# Patient Record
Sex: Female | Born: 1980
Health system: Southern US, Community
[De-identification: ages and names within clinical notes are randomized; demographics above are authoritative.]

## PROBLEM LIST (undated history)

## (undated) DIAGNOSIS — K219 Gastro-esophageal reflux disease without esophagitis: Secondary | ICD-10-CM

## (undated) DIAGNOSIS — K2100 Gastro-esophageal reflux disease with esophagitis, without bleeding: Secondary | ICD-10-CM

## (undated) DIAGNOSIS — L309 Dermatitis, unspecified: Secondary | ICD-10-CM

## (undated) DIAGNOSIS — Z87442 Personal history of urinary calculi: Secondary | ICD-10-CM

## (undated) HISTORY — PX: CHOLECYSTECTOMY: SHX55

## (undated) HISTORY — DX: Gastro-esophageal reflux disease with esophagitis, without bleeding: K21.00

## (undated) HISTORY — DX: Dermatitis, unspecified: L30.9

## (undated) HISTORY — DX: Gastro-esophageal reflux disease with esophagitis: K21.0

---

## 2000-07-31 ENCOUNTER — Emergency Department (HOSPITAL_COMMUNITY): Admission: EM | Admit: 2000-07-31 | Discharge: 2000-07-31 | Payer: Self-pay | Admitting: Emergency Medicine

## 2000-12-04 ENCOUNTER — Ambulatory Visit (HOSPITAL_COMMUNITY): Admission: RE | Admit: 2000-12-04 | Discharge: 2000-12-04 | Payer: Self-pay | Admitting: *Deleted

## 2000-12-04 ENCOUNTER — Encounter: Payer: Self-pay | Admitting: *Deleted

## 2001-01-04 ENCOUNTER — Ambulatory Visit (HOSPITAL_COMMUNITY): Admission: RE | Admit: 2001-01-04 | Discharge: 2001-01-04 | Payer: Self-pay | Admitting: Internal Medicine

## 2001-01-04 ENCOUNTER — Encounter: Payer: Self-pay | Admitting: Internal Medicine

## 2001-04-24 ENCOUNTER — Inpatient Hospital Stay (HOSPITAL_COMMUNITY): Admission: AD | Admit: 2001-04-24 | Discharge: 2001-04-26 | Payer: Self-pay | Admitting: *Deleted

## 2003-01-28 ENCOUNTER — Ambulatory Visit (HOSPITAL_COMMUNITY): Admission: RE | Admit: 2003-01-28 | Discharge: 2003-01-28 | Payer: Self-pay | Admitting: Internal Medicine

## 2003-01-28 ENCOUNTER — Encounter: Payer: Self-pay | Admitting: Internal Medicine

## 2003-05-07 ENCOUNTER — Ambulatory Visit (HOSPITAL_COMMUNITY): Admission: RE | Admit: 2003-05-07 | Discharge: 2003-05-07 | Payer: Self-pay | Admitting: Endocrinology

## 2003-05-19 ENCOUNTER — Encounter (INDEPENDENT_AMBULATORY_CARE_PROVIDER_SITE_OTHER): Payer: Self-pay | Admitting: Internal Medicine

## 2003-05-19 ENCOUNTER — Ambulatory Visit (HOSPITAL_COMMUNITY): Admission: RE | Admit: 2003-05-19 | Discharge: 2003-05-19 | Payer: Self-pay | Admitting: Internal Medicine

## 2004-07-19 ENCOUNTER — Ambulatory Visit (HOSPITAL_COMMUNITY): Admission: RE | Admit: 2004-07-19 | Discharge: 2004-07-19 | Payer: Self-pay | Admitting: *Deleted

## 2004-09-17 ENCOUNTER — Ambulatory Visit (HOSPITAL_COMMUNITY): Admission: RE | Admit: 2004-09-17 | Discharge: 2004-09-17 | Payer: Self-pay | Admitting: *Deleted

## 2004-10-24 ENCOUNTER — Ambulatory Visit (HOSPITAL_COMMUNITY): Admission: RE | Admit: 2004-10-24 | Discharge: 2004-10-24 | Payer: Self-pay | Admitting: *Deleted

## 2004-10-25 ENCOUNTER — Inpatient Hospital Stay (HOSPITAL_COMMUNITY): Admission: AD | Admit: 2004-10-25 | Discharge: 2004-10-26 | Payer: Self-pay | Admitting: *Deleted

## 2004-11-29 ENCOUNTER — Ambulatory Visit (HOSPITAL_COMMUNITY): Admission: AD | Admit: 2004-11-29 | Discharge: 2004-11-29 | Payer: Self-pay | Admitting: Obstetrics and Gynecology

## 2004-12-07 ENCOUNTER — Inpatient Hospital Stay (HOSPITAL_COMMUNITY): Admission: AD | Admit: 2004-12-07 | Discharge: 2004-12-09 | Payer: Self-pay | Admitting: *Deleted

## 2017-10-19 ENCOUNTER — Encounter: Payer: Self-pay | Admitting: Gastroenterology

## 2017-10-24 ENCOUNTER — Encounter: Payer: Self-pay | Admitting: Gastroenterology

## 2017-11-30 ENCOUNTER — Ambulatory Visit: Payer: Self-pay | Admitting: Gastroenterology

## 2017-12-29 ENCOUNTER — Ambulatory Visit: Payer: 59 | Admitting: Gastroenterology

## 2018-07-19 ENCOUNTER — Emergency Department (HOSPITAL_COMMUNITY): Payer: 59

## 2018-07-19 ENCOUNTER — Other Ambulatory Visit: Payer: Self-pay

## 2018-07-19 ENCOUNTER — Encounter (HOSPITAL_COMMUNITY): Payer: Self-pay

## 2018-07-19 ENCOUNTER — Emergency Department (HOSPITAL_COMMUNITY)
Admission: EM | Admit: 2018-07-19 | Discharge: 2018-07-19 | Disposition: A | Discharge status: Home / Self Care | Payer: 59 | Attending: Emergency Medicine | Admitting: Emergency Medicine

## 2018-07-19 DIAGNOSIS — R1110 Vomiting, unspecified: Secondary | ICD-10-CM | POA: Diagnosis present

## 2018-07-19 DIAGNOSIS — Z79899 Other long term (current) drug therapy: Secondary | ICD-10-CM | POA: Insufficient documentation

## 2018-07-19 DIAGNOSIS — R1111 Vomiting without nausea: Secondary | ICD-10-CM

## 2018-07-19 LAB — COMPREHENSIVE METABOLIC PANEL
ALT: 128 U/L — AB (ref 0–44)
AST: 78 U/L — ABNORMAL HIGH (ref 15–41)
Albumin: 4.2 g/dL (ref 3.5–5.0)
Alkaline Phosphatase: 54 U/L (ref 38–126)
Anion gap: 11 (ref 5–15)
BUN: 6 mg/dL (ref 6–20)
CO2: 23 mmol/L (ref 22–32)
Calcium: 9.5 mg/dL (ref 8.9–10.3)
Chloride: 101 mmol/L (ref 98–111)
Creatinine, Ser: 0.81 mg/dL (ref 0.44–1.00)
GFR calc Af Amer: >60 mL/min (ref >60)
GFR calc non Af Amer: >60 mL/min (ref >60)
Glucose, Bld: 88 mg/dL (ref 70–99)
Potassium: 2.9 mmol/L — ABNORMAL LOW (ref 3.5–5.1)
Sodium: 135 mmol/L (ref 135–145)
Total Bilirubin: 0.6 mg/dL (ref 0.3–1.2)
Total Protein: 8.4 g/dL — ABNORMAL HIGH (ref 6.5–8.1)

## 2018-07-19 LAB — LIPASE, BLOOD: Lipase: 25 U/L (ref 11–51)

## 2018-07-19 LAB — I-STAT BETA HCG BLOOD, ED (MC, WL, AP ONLY): I-stat hCG, quantitative: <5 m[IU]/mL (ref <5)

## 2018-07-19 LAB — CBC WITH DIFFERENTIAL/PLATELET
ABS IMMATURE GRANULOCYTES: 0.07 10*3/uL (ref 0.00–0.07)
Basophils Absolute: 0 10*3/uL (ref 0.0–0.1)
Basophils Relative: 0 %
EOS ABS: 0 10*3/uL (ref 0.0–0.5)
Eosinophils Relative: 0 %
HEMATOCRIT: 40.7 % (ref 36.0–46.0)
Hemoglobin: 13.3 g/dL (ref 12.0–15.0)
IMMATURE GRANULOCYTES: 1 %
LYMPHS ABS: 1.6 10*3/uL (ref 0.7–4.0)
Lymphocytes Relative: 14 %
MCH: 29.5 pg (ref 26.0–34.0)
MCHC: 32.7 g/dL (ref 30.0–36.0)
MCV: 90.2 fL (ref 80.0–100.0)
MONOS PCT: 8 %
Monocytes Absolute: 0.9 10*3/uL (ref 0.1–1.0)
NEUTROS ABS: 9.2 10*3/uL — AB (ref 1.7–7.7)
NEUTROS PCT: 77 %
Platelets: 409 10*3/uL — ABNORMAL HIGH (ref 150–400)
RBC: 4.51 MIL/uL (ref 3.87–5.11)
RDW: 13.2 % (ref 11.5–15.5)
WBC: 11.9 10*3/uL — ABNORMAL HIGH (ref 4.0–10.5)
nRBC: 0 % (ref 0.0–0.2)

## 2018-07-19 LAB — RAPID URINE DRUG SCREEN, HOSP PERFORMED
Amphetamines: NOT DETECTED
Barbiturates: NOT DETECTED
Benzodiazepines: NOT DETECTED
COCAINE: NOT DETECTED
Opiates: NOT DETECTED
Tetrahydrocannabinol: POSITIVE — AB

## 2018-07-19 LAB — URINALYSIS, ROUTINE W REFLEX MICROSCOPIC
Bilirubin Urine: NEGATIVE
Glucose, UA: NEGATIVE mg/dL
Hgb urine dipstick: NEGATIVE
Ketones, ur: 20 mg/dL — AB
Leukocytes, UA: NEGATIVE
Nitrite: NEGATIVE
Protein, ur: NEGATIVE mg/dL
Specific Gravity, Urine: 1.013 (ref 1.005–1.030)
pH: 7 (ref 5.0–8.0)

## 2018-07-19 MED ORDER — SUCRALFATE 1 G PO TABS: 1.0000 g | ORAL_TABLET | Freq: Three times a day (TID) | ORAL | 1 refills | Status: DC

## 2018-07-19 MED ORDER — SODIUM CHLORIDE 0.9 % IV SOLN: INTRAVENOUS | Status: DC | PRN

## 2018-07-19 MED ORDER — SODIUM CHLORIDE 0.9 % IV BOLUS
1000.0000 mL | Freq: Once | INTRAVENOUS | Status: AC
  Administered 2018-07-19: 1000 mL via INTRAVENOUS

## 2018-07-19 MED ORDER — POTASSIUM CHLORIDE 10 MEQ/100ML IV SOLN
10.0000 meq | Freq: Once | INTRAVENOUS | Status: AC
  Administered 2018-07-19: 10 meq via INTRAVENOUS
  Filled 2018-07-19: qty 100

## 2018-07-19 MED ORDER — PANTOPRAZOLE SODIUM 40 MG IV SOLR
40.0000 mg | Freq: Once | INTRAVENOUS | Status: AC
  Administered 2018-07-19: 40 mg via INTRAVENOUS
  Filled 2018-07-19: qty 40

## 2018-07-19 MED ORDER — PROMETHAZINE HCL 25 MG RE SUPP: 25.0000 mg | Freq: Four times a day (QID) | RECTAL | 0 refills | Status: DC | PRN

## 2018-07-19 MED ORDER — PROMETHAZINE HCL 25 MG/ML IJ SOLN
25.0000 mg | Freq: Once | INTRAMUSCULAR | Status: AC
  Administered 2018-07-19: 25 mg via INTRAVENOUS
  Filled 2018-07-19: qty 1

## 2018-07-19 NOTE — ED Triage Notes (Signed)
PT reports n/v since Tuesday.  Reports was admitted at  [facility] 3 weeks ago for same.  PT says they told her she had a virus.  Denies diarrhea.  LBM was Tuesday.

## 2018-07-19 NOTE — Discharge Instructions (Signed)
Drink plenty of fluids and follow-up with the stomach doctor.  Dr. Jena Gaussourk next week

## 2018-07-19 NOTE — ED Provider Notes (Signed)
[facility] Provider [facility]   CSN: [id number] Arrival date & time: 07/19/18  1411     History   Chief Complaint Chief Complaint  Patient presents with  . Emesis    HPI Katie Blankenship is a 37 y.o. female.  Patient complains of vomiting.  She states she is been vomiting for a number days.  [place] was seen in the [place] last month and had normal CT of the abdomen and ultrasound.  The history is provided by the patient. No language interpreter was used.  Emesis   This is a new problem. The current episode started more than 2 days ago. The problem occurs 2 to 4 times per day. The problem has not changed since onset.The emesis has an appearance of stomach contents. There has been no fever. Pertinent negatives include no abdominal pain, no chills, no cough, no diarrhea and no headaches.    History reviewed. No pertinent past medical history.  There are no active problems to display for this patient.   Past Surgical History:  Procedure Laterality Date  . CHOLECYSTECTOMY       OB History   None      Home Medications    Prior to Admission medications   Medication Sig Start Date End Date Taking? Authorizing Provider  LARISSIA 0.1-20 MG-MCG tablet Take 1 tablet by mouth daily. 05/25/18  Yes [provider]  ondansetron (ZOFRAN) 8 MG tablet Take 1 tablet by mouth 3 (three) times daily as needed. 07/18/18  Yes [provider]  pantoprazole (PROTONIX) 40 MG tablet Take 1 tablet by mouth daily. 06/22/18  Yes [provider]    Family History No family history on file.  Social History Social History   Tobacco Use  . Smoking status: Never Smoker  . Smokeless tobacco: Never Used  Substance Use Topics  . Alcohol use: Yes    Comment: occ  . Drug use: Yes    Types: Marijuana    Comment: occ     Allergies   Reglan [metoclopramide]   Review of Systems Review of Systems  Constitutional: Negative for appetite change, chills  and fatigue.  HENT: Negative for congestion, ear discharge and sinus pressure.   Eyes: Negative for discharge.  Respiratory: Negative for cough.   Cardiovascular: Negative for chest pain.  Gastrointestinal: Positive for vomiting. Negative for abdominal pain and diarrhea.  Genitourinary: Negative for frequency and hematuria.  Musculoskeletal: Negative for back pain.  Skin: Negative for rash.  Neurological: Negative for seizures and headaches.  Psychiatric/Behavioral: Negative for hallucinations.     Physical Exam Updated Vital Signs BP (!) 141/90 (BP Location: Right Arm)   Pulse 62   Temp 97.7 F (36.5 C) (Oral)   Resp 18   Ht 5\' 3"  (1.6 m)   Wt 89.4 kg   LMP 06/21/2018   SpO2 98%   BMI 34.90 kg/m   Physical Exam  Constitutional: She is oriented to person, place, and time. She appears well-developed.  HENT:  Head: Normocephalic.  Eyes: Conjunctivae and EOM are normal. No scleral icterus.  Neck: Neck supple. No thyromegaly present.  Cardiovascular: Normal rate and regular rhythm. Exam reveals no gallop and no friction rub.  No murmur heard. Pulmonary/Chest: No stridor. She has no wheezes. She has no rales. She exhibits no tenderness.  Abdominal: She exhibits no distension. There is no tenderness. There is no rebound.  Musculoskeletal: Normal range of motion. She exhibits no edema.  Lymphadenopathy:    She has no  cervical adenopathy.  Neurological: She is oriented to person, place, and time. She exhibits normal muscle tone. Coordination normal.  Skin: No rash noted. No erythema.  Psychiatric: She has a normal mood and affect. Her behavior is normal.     ED Treatments / Results  Labs (all labs ordered are listed, but only abnormal results are displayed) Labs Reviewed  CBC WITH DIFFERENTIAL/PLATELET - Abnormal; Notable for the following components:      Result Value   WBC 11.9 (*)    Platelets 409 (*)    Neutro Abs 9.2 (*)    All other components within normal  limits  COMPREHENSIVE METABOLIC PANEL - Abnormal; Notable for the following components:   Potassium 2.9 (*)    Total Protein 8.4 (*)    AST 78 (*)    ALT 128 (*)    All other components within normal limits  LIPASE, BLOOD  URINALYSIS, ROUTINE W REFLEX MICROSCOPIC  RAPID URINE DRUG SCREEN, HOSP PERFORMED  I-STAT BETA HCG BLOOD, ED ([facility], [facility], [facility] ONLY)    EKG None  Radiology No results found.  Procedures Procedures (including critical care time)  Medications Ordered in ED Medications  sodium chloride 0.9 % bolus 1,000 mL (1,000 mLs Intravenous New Bag/Given 07/19/18 1446)  potassium chloride 10 mEq in 100 mL IVPB (has no administration in time range)  promethazine (PHENERGAN) injection 25 mg (25 mg Intravenous Given 07/19/18 1446)  pantoprazole (PROTONIX) injection 40 mg (40 mg Intravenous Given 07/19/18 1447)     Initial Impression / Assessment and Plan / ED Course  I have reviewed the triage vital signs and the nursing notes.  Pertinent labs & imaging results that were available during my care of the patient were reviewed by me and considered in my medical decision making (see chart for details). Patient with persistent vomiting.  Normal CT scan and ultrasound from month ago.  She will be treated for gastritis by adding Carafate to her Protonix and she will give be given Phenergan for nausea and referred to GI      Final Clinical Impressions(s) / ED Diagnoses   Final diagnoses:  None    ED Discharge Orders    None       Bethann Berkshire, MD 07/19/18 1545

## 2018-07-19 NOTE — ED Provider Notes (Addendum)
Pt received at sign out with IVF infusing and Udip/PO challenge pending. Pt 37yo F, c/o N/V for past several days. Please see previous EDP note for full H&P/MDM. Potassium repleted IV. ALT/AST elevated today; pt is already s/p chole. Care Everywhere with labs dated 10/20/2017: AST 189, ALT 312. Pt's CT A/P and [place] abd 06/20/2018 and 06/27/2018 for similar symptoms were without acute process. Tx symptomatically at this time, f/u PMD and GI MD. Pt has tol PO well while in the ED without N/V. VS remain stable. Pt endorses recurrent cyclical vomiting symptoms "every few weeks" and wants to be admitted to the hospital. No clear indication for admission at this time. Long d/w pt by myself and ED RN regarding ED role in healthcare continuum, and encouraged diet modifications, stopping marijuana use, and f/u with GI MD. Dx and testing d/w pt.  Questions answered.  Verb understanding, agreeable to d/c home with outpt f/u.    BP (!) 141/90 (BP Location: Right Arm)   Pulse 62   Temp 97.7 F (36.5 C) (Oral)   Resp 18   Ht 5\' 3"  (1.6 m)   Wt 89.4 kg   LMP 06/21/2018   SpO2 98%   BMI 34.90 kg/m   Results for orders placed or performed during the hospital encounter of 07/19/18  CBC with Differential/Platelet  Result Value Ref Range   WBC 11.9 (H) 4.0 - 10.5 K/uL   RBC 4.51 3.87 - 5.11 MIL/uL   Hemoglobin 13.3 12.0 - 15.0 g/dL   HCT 16.1 09.6 - 04.5 %   MCV 90.2 80.0 - 100.0 fL   MCH 29.5 26.0 - 34.0 pg   MCHC 32.7 30.0 - 36.0 g/dL   RDW 40.9 81.1 - 91.4 %   Platelets 409 (H) 150 - 400 K/uL   nRBC 0.0 0.0 - 0.2 %   Neutrophils Relative % 77 %   Neutro Abs 9.2 (H) 1.7 - 7.7 K/uL   Lymphocytes Relative 14 %   Lymphs Abs 1.6 0.7 - 4.0 K/uL   Monocytes Relative 8 %   Monocytes Absolute 0.9 0.1 - 1.0 K/uL   Eosinophils Relative 0 %   Eosinophils Absolute 0.0 0.0 - 0.5 K/uL   Basophils Relative 0 %   Basophils Absolute 0.0 0.0 - 0.1 K/uL   Immature Granulocytes 1 %   Abs Immature Granulocytes 0.07 0.00  - 0.07 K/uL  Comprehensive metabolic panel  Result Value Ref Range   Sodium 135 135 - 145 mmol/L   Potassium 2.9 (L) 3.5 - 5.1 mmol/L   Chloride 101 98 - 111 mmol/L   CO2 23 22 - 32 mmol/L   Glucose, Bld 88 70 - 99 mg/dL   BUN 6 6 - 20 mg/dL   Creatinine, Ser 7.82 0.44 - 1.00 mg/dL   Calcium 9.5 8.9 - 95.6 mg/dL   Total Protein 8.4 (H) 6.5 - 8.1 g/dL   Albumin 4.2 3.5 - 5.0 g/dL   AST 78 (H) 15 - 41 U/L   ALT 128 (H) 0 - 44 U/L   Alkaline Phosphatase 54 38 - 126 U/L   Total Bilirubin 0.6 0.3 - 1.2 mg/dL   GFR calc non Af Amer >60 >60 mL/min   GFR calc Af Amer >60 >60 mL/min   Anion gap 11 5 - 15  Lipase, blood  Result Value Ref Range   Lipase 25 11 - 51 U/L  Urinalysis, Routine w reflex microscopic  Result Value Ref Range   Color, Urine YELLOW YELLOW  APPearance CLEAR CLEAR   Specific Gravity, Urine 1.013 1.005 - 1.030   pH 7.0 5.0 - 8.0   Glucose, UA NEGATIVE NEGATIVE mg/dL   Hgb urine dipstick NEGATIVE NEGATIVE   Bilirubin Urine NEGATIVE NEGATIVE   Ketones, ur 20 (A) NEGATIVE mg/dL   Protein, ur NEGATIVE NEGATIVE mg/dL   Nitrite NEGATIVE NEGATIVE   Leukocytes, UA NEGATIVE NEGATIVE  Rapid urine drug screen (hospital performed)  Result Value Ref Range   Opiates NONE DETECTED NONE DETECTED   Cocaine NONE DETECTED NONE DETECTED   Benzodiazepines NONE DETECTED NONE DETECTED   Amphetamines NONE DETECTED NONE DETECTED   Tetrahydrocannabinol POSITIVE (A) NONE DETECTED   Barbiturates NONE DETECTED NONE DETECTED  I-Stat Beta hCG blood, ED ([facility], [facility], [facility] only)  Result Value Ref Range   I-stat hCG, quantitative <5.0 <5 mIU/mL   Comment 3           Dg Abd Acute W/chest Result Date: 07/19/2018 CLINICAL DATA:  Nausea and vomiting. EXAM: DG ABDOMEN ACUTE W/ 1V CHEST COMPARISON:  Chest x-ray July 14, 2010 FINDINGS: The chest is normal. No free air, portal venous gas, or pneumatosis. Cholecystectomy clips are identified. There is an apparent 5 mm stone in the lower pole left  kidney. No evidence of bowel obstruction. No other acute abnormalities. IMPRESSION: 1. 4 mm stone in the lower pole of the left kidney. 2. No other abnormalities. Electronically Signed   By: Gerome Samavid  Williams III M.D   On: 07/19/2018 15:41          Samuel JesterMcManus, Kyrstan Gotwalt, DO 07/19/18 1853

## 2018-07-19 NOTE — ED Notes (Signed)
Patient acting very agitated towards me. Patient was asleep since 3:30 pm, woke up and stated that we have not done anything for her nausea. Patient strongly suggesting we give her Phenergan.

## 2018-07-19 NOTE — ED Notes (Signed)
Patient tolerating oral fluids  

## 2018-07-23 ENCOUNTER — Telehealth: Payer: Self-pay | Admitting: General Practice

## 2018-07-23 NOTE — Telephone Encounter (Signed)
Patient had an appointment back in 12/2017 and a note was made that she needed to have a referral via [employer] portal from her pcp.  The patient[place]ner [place]an[place].  Rotuing to [place] to follow-up

## 2018-07-24 NOTE — Telephone Encounter (Signed)
Spoke to LohmanAubrey at physicians office and she said she would speak to the physician and get this taken care of.  The referral coordinator was unaware if getting the insurance referral was something they had to do or the patient had to do.

## 2018-07-26 ENCOUNTER — Encounter: Payer: Self-pay | Admitting: Gastroenterology

## 2018-07-26 ENCOUNTER — Telehealth: Payer: Self-pay | Admitting: *Deleted

## 2018-07-26 ENCOUNTER — Ambulatory Visit: Payer: 59 | Admitting: Gastroenterology

## 2018-07-26 ENCOUNTER — Encounter: Payer: Self-pay | Admitting: *Deleted

## 2018-07-26 DIAGNOSIS — R740 Nonspecific elevation of levels of transaminase and lactic acid dehydrogenase [LDH]: Secondary | ICD-10-CM

## 2018-07-26 DIAGNOSIS — R1112 Projectile vomiting: Secondary | ICD-10-CM

## 2018-07-26 DIAGNOSIS — R7401 Elevation of levels of liver transaminase levels: Secondary | ICD-10-CM | POA: Insufficient documentation

## 2018-07-26 DIAGNOSIS — R112 Nausea with vomiting, unspecified: Secondary | ICD-10-CM | POA: Insufficient documentation

## 2018-07-26 MED ORDER — PANTOPRAZOLE SODIUM 40 MG PO TBEC: DELAYED_RELEASE_TABLET | ORAL | 11 refills | Status: DC

## 2018-07-26 MED ORDER — PROMETHAZINE HCL 12.5 MG PO TABS: ORAL_TABLET | ORAL | 3 refills | Status: DC

## 2018-07-26 MED ORDER — PROMETHAZINE HCL 25 MG RE SUPP: RECTAL | 0 refills | Status: DC

## 2018-07-26 NOTE — Patient Instructions (Addendum)
DRINK WATER TO KEEP YOUR URINE LIGHT YELLOW.  DO NOT SMOKE WEED.   FOLLOW A LOW FAT DIET. MEATS SHOULD BE BAKED, BROILED, OR BOILED. AVOID FRIED FOODS. SEE INFO BELOW.  AVOID REFLUX TRIGGERS. SEE INFO BELOW.   INCREASE PROTONIX. TAKE 30 MINUTES PRIOR TO MEALS TWICE DAILY.  USE PHENERGAN WHEN NEEDED FOR NAUSEA AND VOMITING.   COMPLETE LABS.  COMPLETE UPPER ENDOSCOPY.    FOLLOW UP IN 4 MOS.     Lifestyle and home remedies TO MANAGE REFLUX/CHEST PAIN  You may eliminate or reduce the frequency of heartburn by making the following lifestyle changes:  . Control your weight. Being overweight is a major risk factor for heartburn and GERD. Excess pounds put pressure on your abdomen, pushing up your stomach and causing acid to back up into your esophagus.   . Eat smaller meals. 4 TO 6 MEALS A DAY. This reduces pressure on the lower esophageal sphincter, helping to prevent the valve from opening and acid from washing back into your esophagus.   Allena Earing. Loosen your belt. Clothes that fit tightly around your waist put pressure on your abdomen and the lower esophageal sphincter.   . Eliminate heartburn triggers. Everyone has specific triggers. Common triggers such as fatty or fried foods, spicy food, tomato sauce, carbonated beverages, alcohol, chocolate, mint, garlic, onion, caffeine and nicotine may make heartburn worse.   Marland Kitchen. Avoid stooping or bending. Tying your shoes is OK. Bending over for longer periods to weed your garden isn't, especially soon after eating.   . Don't lie down after a meal. Wait at least three to four hours after eating before going to bed, and don't lie down right after eating.   Marland Kitchen. PUT THE HEAD OF YOUR BED ON 6 INCH BLOCKS.   Alternative medicine . Several home remedies exist for treating GERD, but they provide only temporary relief. They include drinking baking soda (sodium bicarbonate) added to water or drinking other fluids such as baking soda mixed with cream of  tartar and water.  . Although these liquids create temporary relief by neutralizing, washing away or buffering acids, eventually they aggravate the situation by adding gas and fluid to your stomach, increasing pressure and causing more acid reflux. Further, adding more sodium to your diet may increase your blood pressure and add stress to your heart, and excessive bicarbonate ingestion can alter the acid-base balance in your body.      Low-Fat Diet BREADS, CEREALS, PASTA, RICE, DRIED PEAS, AND BEANS These products are high in carbohydrates and most are low in fat. Therefore, they can be increased in the diet as substitutes for fatty foods. They too, however, contain calories and should not be eaten in excess. Cereals can be eaten for snacks as well as for breakfast.   FRUITS AND VEGETABLES It is good to eat fruits and vegetables. Besides being sources of fiber, both are rich in vitamins and some minerals. They help you get the daily allowances of these nutrients. Fruits and vegetables can be used for snacks and desserts.  MEATS Limit lean meat, chicken, [place], and fish to no more than 6 ounces per day. Beef, Pork, and Lamb Use lean cuts of beef, pork, and lamb. Lean cuts include:  Extra-lean ground beef.  Arm roast.  Sirloin tip.  Center-cut ham.  Round steak.  Loin chops.  Rump roast.  Tenderloin.  Trim all fat off the outside of meats before cooking. It is not necessary to severely decrease the intake of red meat,  but lean choices should be made. Lean meat is rich in protein and contains a highly absorbable form of iron. Premenopausal women, in particular, should avoid reducing lean red meat because this could increase the risk for low red blood cells (iron-deficiency anemia).  Chicken and [place] These are good sources of protein. The fat of poultry can be reduced by removing the skin and underlying fat layers before cooking. Chicken and [place] can be substituted for lean red meat in  the diet. Poultry should not be fried or covered with high-fat sauces. Fish and Shellfish Fish is a good source of protein. Shellfish contain cholesterol, but they usually are low in saturated fatty acids. The preparation of fish is important. Like chicken and [place], they should not be fried or covered with high-fat sauces. EGGS Egg whites contain no fat or cholesterol. They can be eaten often. Try 1 to 2 egg whites instead of whole eggs in recipes or use egg substitutes that do not contain yolk. MILK AND DAIRY PRODUCTS Use skim or 1% milk instead of 2% or whole milk. Decrease whole milk, natural, and processed cheeses. Use nonfat or low-fat (2%) cottage cheese or low-fat cheeses made from vegetable oils. Choose nonfat or low-fat (1 to 2%) yogurt. Experiment with evaporated skim milk in recipes that call for heavy cream. Substitute low-fat yogurt or low-fat cottage cheese for sour cream in dips and salad dressings. Have at least 2 servings of low-fat dairy products, such as 2 glasses of skim (or 1%) milk each day to help get your daily calcium intake. FATS AND OILS Reduce the total intake of fats, especially saturated fat. Butterfat, lard, and beef fats are high in saturated fat and cholesterol. These should be avoided as much as possible. Vegetable fats do not contain cholesterol, but certain vegetable fats, such as coconut oil, palm oil, and palm kernel oil are very high in saturated fats. These should be limited. These fats are often used in bakery goods, processed foods, popcorn, oils, and nondairy creamers. Vegetable shortenings and some peanut butters contain hydrogenated oils, which are also saturated fats. Read the labels on these foods and check for saturated vegetable oils. Unsaturated vegetable oils and fats do not raise blood cholesterol. However, they should be limited because they are fats and are high in calories. Total fat should still be limited to 30% of your daily caloric intake.  Desirable liquid vegetable oils are corn oil, cottonseed oil, olive oil, canola oil, safflower oil, soybean oil, and sunflower oil. Peanut oil is not as good, but small amounts are acceptable. Buy a heart-healthy tub margarine that has no partially hydrogenated oils in the ingredients. Mayonnaise and salad dressings often are made from unsaturated fats, but they should also be limited because of their high calorie and fat content. Seeds, nuts, peanut butter, olives, and avocados are high in fat, but the fat is mainly the unsaturated type. These foods should be limited mainly to avoid excess calories and fat. OTHER EATING TIPS Snacks  Most sweets should be limited as snacks. They tend to be rich in calories and fats, and their caloric content outweighs their nutritional value. Some good choices in snacks are graham crackers, melba toast, soda crackers, bagels (no egg), English muffins, fruits, and vegetables. These snacks are preferable to snack crackers, [place] fries, TORTILLA CHIPS, and POTATO chips. Popcorn should be air-popped or cooked in small amounts of liquid vegetable oil. Desserts Eat fruit, low-fat yogurt, and fruit ices instead of pastries, cake, and cookies. Sherbet,  angel food cake, gelatin dessert, frozen low-fat yogurt, or other frozen products that do not contain saturated fat (pure fruit juice bars, frozen ice pops) are also acceptable.  COOKING METHODS Choose those methods that use little or no fat. They include: Poaching.  Braising.  Steaming.  Grilling.  Baking.  Stir-frying.  Broiling.  Microwaving.  Foods can be cooked in a nonstick pan without added fat, or use a nonfat cooking spray in regular cookware. Limit fried foods and avoid frying in saturated fat. Add moisture to lean meats by using water, broth, cooking wines, and other nonfat or low-fat sauces along with the cooking methods mentioned above. Soups and stews should be chilled after cooking. The fat that forms on top  after a few hours in the refrigerator should be skimmed off. When preparing meals, avoid using excess salt. Salt can contribute to raising blood pressure in some people.  EATING AWAY FROM HOME Order entres, potatoes, and vegetables without sauces or butter. When meat exceeds the size of a deck of cards (3 to 4 ounces), the rest can be taken home for another meal. Choose vegetable or fruit salads and ask for low-calorie salad dressings to be served on the side. Use dressings sparingly. Limit high-fat toppings, such as bacon, crumbled eggs, cheese, sunflower seeds, and olives. Ask for heart-healthy tub margarine instead of butter.

## 2018-07-26 NOTE — Progress Notes (Signed)
 Subjective:    Patient ID: Katie Blankenship, female    DOB: 07/02/1981, 37 y.o.   MRN: [id number]  HPI HAD GB REMOVED DUE TO PAIN IN RUQ/BURPING/INDIGESTION IN DEC 2018. SYMPTOMS GOT WORSE IN October 2019(246 LBS). TROUBLE WITH VOMITING SINCE OCT 29. NO TRIGGERS. OUT OF THE BLUE. FELT QUEAZY/NAUSEAOUS AND GOT SO BAD AND SENT HER HOME. ADMITTED FOR 3 DAYS 2ND TIME. NO VIRAL PRODROME. HEARTBURN(COUPLE TIMES A WEEK ABOUT DAILY, ACID COMING UP CHEST, BURNING IN CHEST AND THROAT). INDIGESTION(GAS PAIN IN CHEST/HURTS IN UPPER AND CHEST)/EXCESSIVE BURPING. STRESS: 2 TEENAGERS, LIFE. IF HAS BURPS AND GAS CAN'T SWALLOW. PHENERGAN HELPS CONTROL SYMPTOMS. PROTONIX ONCE DAILY DOES NOT REALLY. CARAFATE PILLS CAN'T SWALLOW. WEIGHT 246 LBS AND NOW DOWN TO 190 LBS. BURPING FIRST THE FOAM IN BACK OF THROAT AND THEN NAUSEA/VOMITING THAT JUST WON'T START. SINUS DRAINAGE CAN TRIGGER IT. PHENERGAN HELPS. DIDN'T EAT ANY THANKSGIVING. NOTHING IN 8 DAYS. RARE ETOH. NO ASPIRIN, BC/GOODY POWDERS, IBUPROFEN/MOTRIN, OR NAPROXEN/ALEVE. FAMILY HISTORY OF SPRUE, MOTHER HAD ASTHMA. FATHER HAD ECZEMA. HAPPENING EVERY 2 WEEKS SINCE OCT 2019: 3 ED VISITS. 1 HOSPITAL ADMISSION. TRAVEL: NO. RARE MIGRAINES. NO BULEMIA OR ANOREXIA. SMOKES WEED: 1-2X/WEEK SINCE OCT 2019.   PT DENIES FEVER, CHILLS, VISION CHANGES, FACE PAIN, EAR PAIN, TEETH PAIN, RINGING IN EARS, DYSURIA, BLOOD IN URINE,  HEMATOCHEZIA, HEMATEMESIS, nausea, melena, diarrhea, SHORTNESS OF BREATH, CHANGE IN BOWEL IN HABITS, constipation,  problems swallowing, problems with sedation, OR heartburn or indigestion.  Past Medical History:  Diagnosis Date  . GERD with esophagitis     Past Surgical History:  Procedure Laterality Date  . CHOLECYSTECTOMY      Allergies  Allergen Reactions  . Reglan [Metoclopramide]     Muscle spasms    Current Outpatient Medications  Medication Sig    . LARISSIA 0.1-20 MG-MCG tablet Take 1 tablet by mouth daily.    . meclizine  (ANTIVERT) 25 MG tablet Take 25 mg by mouth as needed for dizziness.    . ondansetron (ZOFRAN) 8 MG tablet Take 1 tablet by mouth 3 (three) times daily as needed.    . promethazine (PHENERGAN) 25 MG suppository Place 1 suppository (25 mg total) rectally every 6 (six) hours as needed for nausea or vomiting.    . Simethicone (GAS-X PO) Take by mouth as needed.    .      . pantoprazole (PROTONIX) 40 MG tablet Take 1 tablet by mouth daily.     Family History  Problem Relation Age of Onset  . Asthma Mother   . Eczema Father   . Lung cancer Maternal Grandfather   . Colon cancer Neg Hx   . Colon polyps Neg Hx    Social History   Socioeconomic History  . Marital status: Divorced    Spouse name: Not on file  . Number of children: Not on file  . Years of education: Not on file  . Highest education level: Not on file  Occupational History  . Not on file  Social Needs  . Financial resource strain: Not on file  . Food insecurity:    Worry: Not on file    Inability: Not on file  . Transportation needs:    Medical: Not on file    Non-medical: Not on file  Tobacco Use  . Smoking status: Never Smoker  . Smokeless tobacco: Never Used  Substance and Sexual Activity  . Alcohol use: Yes    Comment: rare  . Drug use: Yes      Types: Marijuana    Comment: occ  . Sexual activity: Not on file  Lifestyle  . Physical activity:    Days per week: Not on file    Minutes per session: Not on file  . Stress: Not on file  Relationships  . Social connections:    Talks on phone: Not on file    Gets together: Not on file    Attends religious service: Not on file    Active member of club or organization: Not on file    Attends meetings of clubs or organizations: Not on file    Relationship status: Not on file  Other Topics Concern  . Not on file  Social History Narrative   WORKS FOR [employer]. HOBBIES: PAINTING, READING , SWIMMING. 2 KIDS: 2 DAUGHTERS(AGE 13, 17)   Review of  Systems PER HPI OTHERWISE ALL SYSTEMS ARE NEGATIVE.    Objective:   Physical Exam  Constitutional: She is oriented to person, place, and time. She appears well-developed and well-nourished. No distress.  HENT:  Head: Normocephalic and atraumatic.  Mouth/Throat: Oropharynx is clear and moist. No oropharyngeal exudate.  Eyes: Pupils are equal, round, and reactive to light. No scleral icterus.  Neck: Normal range of motion. Neck supple.  Cardiovascular: Normal rate, regular rhythm and normal heart sounds.  Pulmonary/Chest: Effort normal and breath sounds normal. No respiratory distress.  Abdominal: Soft. Bowel sounds are normal. She exhibits no distension. There is tenderness. There is no rebound.  MILD TO  MODERATE TTP IN THE EPIGASTRIUM & BUQS.  Musculoskeletal: She exhibits no edema.  Lymphadenopathy:    She has no cervical adenopathy.  Neurological: She is alert and oriented to person, place, and time.  NO FOCAL DEFICITS  Psychiatric:  FLAT AFFECT, NL MOOD  Vitals reviewed.     Assessment & Plan:   

## 2018-07-26 NOTE — Progress Notes (Signed)
ON RECALL  °

## 2018-07-26 NOTE — Progress Notes (Signed)
CC'D TO PCP °

## 2018-07-26 NOTE — Assessment & Plan Note (Signed)
SYMPTOMS NOT CONTROLLED AND MAY BE DUE TO UNCONTROLLED GERD, CANNABANOID HYPEREMESIS SYNDROME, . DIFFERENTIAL DIAGNOSIS INCLUDES: H PYLORI GASTRITIS, UNCONTROLLED GERD, CELIAC SPRUE, EOSINOPHILIC GASTRITIS, LESS LIKELY GE JUNCTION TUMOR, GASTRIC OR PANCREATIC CA OR CHRONIC MESENTERIC ISCHEMIA.  DRINK WATER TO KEEP YOUR URINE LIGHT YELLOW. FOLLOW A LOW FAT DIET. MEATS SHOULD BE BAKED, BROILED, OR BOILED. AVOID FRIED FOODS.  HANDOUT GIVEN. AVOID REFLUX TRIGGERS.  HANDOUT GIVEN. INCREASE PROTONIX. TAKE 30 MINUTES PRIOR TO MEALS TWICE DAILY. USE PHENERGAN WHEN NEEDED FOR NAUSEA AND VOMITING. COMPLETE LABS. COMPLETE UPPER ENDOSCOPY.   FOLLOW UP IN 4 MOS.

## 2018-07-26 NOTE — H&P (View-Only) (Signed)
Subjective:    Patient ID: Katie Blankenship, female    DOB: [birth year], 37 y.o.   MRN: [id number]  HPI HAD GB REMOVED DUE TO PAIN IN RUQ/BURPING/INDIGESTION IN DEC 2018. SYMPTOMS GOT WORSE IN October 2019(246 LBS). TROUBLE WITH VOMITING SINCE OCT 29. NO TRIGGERS. OUT OF THE BLUE. FELT QUEAZY/NAUSEAOUS AND GOT SO BAD AND SENT HER HOME. ADMITTED FOR 3 DAYS 2ND TIME. NO VIRAL PRODROME. HEARTBURN(COUPLE TIMES A WEEK ABOUT DAILY, ACID COMING UP CHEST, BURNING IN CHEST AND THROAT). INDIGESTION(GAS PAIN IN CHEST/HURTS IN UPPER AND CHEST)/EXCESSIVE BURPING. STRESS: 2 TEENAGERS, LIFE. IF HAS BURPS AND GAS CAN'T SWALLOW. PHENERGAN HELPS CONTROL SYMPTOMS. PROTONIX ONCE DAILY DOES NOT REALLY. CARAFATE PILLS CAN'T SWALLOW. WEIGHT 246 LBS AND NOW DOWN TO 190 LBS. BURPING FIRST THE FOAM IN BACK OF THROAT AND THEN NAUSEA/VOMITING THAT JUST WON'T START. SINUS DRAINAGE CAN TRIGGER IT. PHENERGAN HELPS. DIDN'T EAT ANY THANKSGIVING. NOTHING IN 8 DAYS. RARE ETOH. NO ASPIRIN, BC/GOODY POWDERS, IBUPROFEN/MOTRIN, OR NAPROXEN/ALEVE. FAMILY HISTORY OF SPRUE, MOTHER HAD ASTHMA. FATHER HAD ECZEMA. HAPPENING EVERY 2 WEEKS SINCE OCT 2019: 3 ED VISITS. 1 HOSPITAL ADMISSION. TRAVEL: NO. RARE MIGRAINES. NO BULEMIA OR ANOREXIA. SMOKES WEED: 1-2X/WEEK SINCE OCT 2019.   PT DENIES FEVER, CHILLS, VISION CHANGES, FACE PAIN, EAR PAIN, TEETH PAIN, RINGING IN EARS, DYSURIA, BLOOD IN URINE,  HEMATOCHEZIA, HEMATEMESIS, nausea, melena, diarrhea, SHORTNESS OF BREATH, CHANGE IN BOWEL IN HABITS, constipation,  problems swallowing, problems with sedation, OR heartburn or indigestion.  Past Medical History:  Diagnosis Date  . GERD with esophagitis     Past Surgical History:  Procedure Laterality Date  . CHOLECYSTECTOMY      Allergies  Allergen Reactions  . Reglan [Metoclopramide]     Muscle spasms    Current Outpatient Medications  Medication Sig    . LARISSIA 0.1-20 MG-MCG tablet Take 1 tablet by mouth daily.    . meclizine  (ANTIVERT) 25 MG tablet Take 25 mg by mouth as needed for dizziness.    . ondansetron (ZOFRAN) 8 MG tablet Take 1 tablet by mouth 3 (three) times daily as needed.    . promethazine (PHENERGAN) 25 MG suppository Place 1 suppository (25 mg total) rectally every 6 (six) hours as needed for nausea or vomiting.    . Simethicone (GAS-X PO) Take by mouth as needed.    .      . pantoprazole (PROTONIX) 40 MG tablet Take 1 tablet by mouth daily.     Family History  Problem Relation Age of Onset  . Asthma Mother   . Eczema Father   . Lung cancer Maternal Grandfather   . Colon cancer Neg Hx   . Colon polyps Neg Hx    Social History   Socioeconomic History  . Marital status: Divorced    Spouse name: Not on file  . Number of children: Not on file  . Years of education: Not on file  . Highest education level: Not on file  Occupational History  . Not on file  Social Needs  . Financial resource strain: Not on file  . Food insecurity:    Worry: Not on file    Inability: Not on file  . Transportation needs:    Medical: Not on file    Non-medical: Not on file  Tobacco Use  . Smoking status: Never Smoker  . Smokeless tobacco: Never Used  Substance and Sexual Activity  . Alcohol use: Yes    Comment: rare  . Drug use: Yes  Types: Marijuana    Comment: occ  . Sexual activity: Not on file  Lifestyle  . Physical activity:    Days per week: Not on file    Minutes per session: Not on file  . Stress: Not on file  Relationships  . Social connections:    Talks on phone: Not on file    Gets together: Not on file    Attends religious service: Not on file    Active member of club or organization: Not on file    Attends meetings of clubs or organizations: Not on file    Relationship status: Not on file  Other Topics Concern  . Not on file  Social History Narrative   WORKS FOR [employer]. HOBBIES: PAINTING, READING , SWIMMING. 2 KIDS: 2 DAUGHTERS(AGE 67, 17)   Review of  Systems PER HPI OTHERWISE ALL SYSTEMS ARE NEGATIVE.    Objective:   Physical Exam  Constitutional: She is oriented to person, place, and time. She appears well-developed and well-nourished. No distress.  HENT:  Head: Normocephalic and atraumatic.  Mouth/Throat: Oropharynx is clear and moist. No oropharyngeal exudate.  Eyes: Pupils are equal, round, and reactive to light. No scleral icterus.  Neck: Normal range of motion. Neck supple.  Cardiovascular: Normal rate, regular rhythm and normal heart sounds.  Pulmonary/Chest: Effort normal and breath sounds normal. No respiratory distress.  Abdominal: Soft. Bowel sounds are normal. She exhibits no distension. There is tenderness. There is no rebound.  MILD TO  MODERATE TTP IN THE EPIGASTRIUM & BUQS.  Musculoskeletal: She exhibits no edema.  Lymphadenopathy:    She has no cervical adenopathy.  Neurological: She is alert and oriented to person, place, and time.  NO FOCAL DEFICITS  Psychiatric:  FLAT AFFECT, NL MOOD  Vitals reviewed.     Assessment & Plan:

## 2018-07-26 NOTE — Telephone Encounter (Signed)
LMTCB. Per [facility] schedule procedure in 1 month

## 2018-07-26 NOTE — Assessment & Plan Note (Signed)
Elevated liver enzyme sin mar 2019 and improved in Nov 2019 with weight loss but remain elevated AND Most likely DUE TO NASH, LESS LIKELY VIRAL HEPATITIS, OR AUTOIMMUNE HEPATITIS.  DRAW VIRAL SEROLOGIES AND HEPATITIS PANEL. CONTINUE YOUR WEIGHT LOSS EFFORTS. FOLLOW UP IN 4 MOS. WILL OBTAIN IMAGING FOR [facility].

## 2018-07-27 ENCOUNTER — Telehealth: Payer: Self-pay

## 2018-07-27 NOTE — Telephone Encounter (Signed)
[place] [facility] [place] [facility]. The Dx code given for Lab work didn't cover the other lab test needed. Other DX that was used during pts ov R74.0 was given and it worked for the billing codes needed.

## 2018-07-27 NOTE — Telephone Encounter (Signed)
Left detail message advising patient procedure will not be performed on 07/30/18 and to call back to r/s

## 2018-07-27 NOTE — Telephone Encounter (Signed)
LMOVM x 2 for pt

## 2018-07-30 ENCOUNTER — Other Ambulatory Visit: Payer: Self-pay

## 2018-07-30 ENCOUNTER — Encounter (HOSPITAL_COMMUNITY): Payer: Self-pay

## 2018-07-30 ENCOUNTER — Encounter (HOSPITAL_COMMUNITY)
Admission: RE | Admit: 2018-07-30 | Discharge: 2018-07-30 | Disposition: A | Discharge status: Home / Self Care | Payer: 59 | Source: Ambulatory Visit | Attending: Gastroenterology | Admitting: Gastroenterology

## 2018-07-30 ENCOUNTER — Telehealth: Payer: Self-pay | Admitting: Gastroenterology

## 2018-07-30 ENCOUNTER — Other Ambulatory Visit: Payer: Self-pay | Admitting: *Deleted

## 2018-07-30 DIAGNOSIS — K2980 Duodenitis without bleeding: Secondary | ICD-10-CM | POA: Diagnosis not present

## 2018-07-30 DIAGNOSIS — K219 Gastro-esophageal reflux disease without esophagitis: Secondary | ICD-10-CM | POA: Diagnosis not present

## 2018-07-30 DIAGNOSIS — K222 Esophageal obstruction: Secondary | ICD-10-CM | POA: Diagnosis not present

## 2018-07-30 DIAGNOSIS — Z79899 Other long term (current) drug therapy: Secondary | ICD-10-CM | POA: Diagnosis not present

## 2018-07-30 DIAGNOSIS — Z01812 Encounter for preprocedural laboratory examination: Secondary | ICD-10-CM | POA: Insufficient documentation

## 2018-07-30 DIAGNOSIS — K449 Diaphragmatic hernia without obstruction or gangrene: Secondary | ICD-10-CM | POA: Diagnosis not present

## 2018-07-30 DIAGNOSIS — Z888 Allergy status to other drugs, medicaments and biological substances status: Secondary | ICD-10-CM | POA: Diagnosis not present

## 2018-07-30 DIAGNOSIS — K2970 Gastritis, unspecified, without bleeding: Secondary | ICD-10-CM | POA: Diagnosis not present

## 2018-07-30 DIAGNOSIS — R1013 Epigastric pain: Secondary | ICD-10-CM

## 2018-07-30 HISTORY — DX: Gastro-esophageal reflux disease without esophagitis: K21.9

## 2018-07-30 LAB — ANA: Anti Nuclear Antibody(ANA): NEGATIVE

## 2018-07-30 LAB — HEPATITIS C ANTIBODY
Hepatitis C Ab: NONREACTIVE
SIGNAL TO CUT-OFF: 0.01 (ref <1.00)

## 2018-07-30 LAB — BASIC METABOLIC PANEL
Anion gap: 10 (ref 5–15)
BUN: <5 mg/dL — ABNORMAL LOW (ref 6–20)
CO2: 25 mmol/L (ref 22–32)
Calcium: 9.1 mg/dL (ref 8.9–10.3)
Chloride: 98 mmol/L (ref 98–111)
Creatinine, Ser: 0.75 mg/dL (ref 0.44–1.00)
GFR calc Af Amer: >60 mL/min (ref >60)
GFR calc non Af Amer: >60 mL/min (ref >60)
Glucose, Bld: 115 mg/dL — ABNORMAL HIGH (ref 70–99)
Potassium: 3.1 mmol/L — ABNORMAL LOW (ref 3.5–5.1)
Sodium: 133 mmol/L — ABNORMAL LOW (ref 135–145)

## 2018-07-30 LAB — HEMOGLOBIN A1C
Hgb A1c MFr Bld: 5.5 % of total Hgb (ref <5.7)
Mean Plasma Glucose: 111 (calc)
eAG (mmol/L): 6.2 (calc)

## 2018-07-30 LAB — HCG, SERUM, QUALITATIVE: PREG SERUM: NEGATIVE

## 2018-07-30 LAB — ANTI-SMOOTH MUSCLE ANTIBODY, IGG: Actin (Smooth Muscle) Antibody (IGG): <20 U (ref <20)

## 2018-07-30 LAB — CORTISOL: Cortisol, Plasma: 36.5 ug/dL — ABNORMAL HIGH

## 2018-07-30 LAB — IGG, IGA, IGM
IgG (Immunoglobin G), Serum: 773 mg/dL (ref 600–1640)
IgM, Serum: 79 mg/dL (ref 50–300)
Immunoglobulin A: 193 mg/dL (ref 47–310)

## 2018-07-30 LAB — HEPATITIS B SURFACE ANTIGEN: Hepatitis B Surface Ag: NONREACTIVE

## 2018-07-30 LAB — HEPATITIS B SURFACE ANTIBODY,QUALITATIVE: Hep B S Ab: NONREACTIVE

## 2018-07-30 LAB — HEPATITIS A ANTIBODY, TOTAL: Hepatitis A AB,Total: NONREACTIVE

## 2018-07-30 NOTE — Telephone Encounter (Signed)
Per carolyn can do pre-op today at 3:00pm.  Called patient and is aware. I have discussed new instructions with her. [facility] and it is p[facility]. Tracking #[id number]

## 2018-07-30 NOTE — Telephone Encounter (Signed)
PLEASE CALL PT. Ok to schedule with EGD/MAC ON DEC 10.

## 2018-07-30 NOTE — Telephone Encounter (Signed)
Received VM from patient. Called back and went straight to VM. LMTCB

## 2018-07-30 NOTE — Progress Notes (Signed)
Pt is aware of Hem A1c result. I called the lab and the ANA and ASMA are in process and will not be resulted for at least another day.

## 2018-07-30 NOTE — Telephone Encounter (Signed)
[place] [facility]ita [facility]. PA was approved. Auth# [id number]

## 2018-07-30 NOTE — Telephone Encounter (Signed)
Pt is aware.  

## 2018-07-30 NOTE — Telephone Encounter (Signed)
Pt said she was returning a call. I told her MS wasn't available at the moment and  I could take a message. She said she wasn't sure when her procedure was. I told her that MS left a detailed message on VM on Friday that she would not have her procedure on 12/9 and to call to reschedule. Please call her back at [phone]

## 2018-07-30 NOTE — Telephone Encounter (Signed)
Spoke with patient and she is scheduled for 09/10/18 at 3:00pm. Patient was not happy about it at all. She stated she was upset as too why we would take her sisters advice when she is not the patient. She states she is the one that is still throwing up several times a day, can't keep solid foods down and has lost 3lbs since being seen here on Thursday, not her sister. I advised I will send [facility] a message. Please advise Dr. Darrick PennaFields.

## 2018-07-30 NOTE — Telephone Encounter (Signed)
Spoke with carolyn and is checking to see when pre-op can be done today

## 2018-07-30 NOTE — Telephone Encounter (Signed)
Routing to Mindy to schedule procedure in the or for tomorrow

## 2018-07-30 NOTE — Telephone Encounter (Signed)
Renee at [facility] called office. Inform of auth# for procedure.

## 2018-07-30 NOTE — Progress Notes (Signed)
[facility] called the [facility] and [facility]. ANA and ASMA will not be completed for at least another day.

## 2018-07-30 NOTE — Telephone Encounter (Signed)
SEE PRIOR pn

## 2018-07-30 NOTE — Telephone Encounter (Signed)
PLEASE CALL PT. She does NOT HAVE VIRAL HEPATITIS. I AM STILL WAITING ON SOME LAB RESULTS FROM [facility]. THEY ARE SEND OUTS. SHE IS NOT IMMUNE TO HEPATITIS A OR B. SHE SHOULD SEE HER PCP TO OBTAIN THE VACCINE.

## 2018-07-30 NOTE — Progress Notes (Signed)
cc'd to pcp 

## 2018-07-30 NOTE — Patient Instructions (Signed)
Katie Blankenship  07/30/2018     @PREFPERIOPPHARMACY @   Your procedure is scheduled on  07/31/2018 .  Report to [facility] at  715   A.M.  Call this number if you have problems the morning of surgery:  [phone]   Remember:  Follow the diet and prep instructions given to you by Dr Evelina Dun office.                     Take these medicines the morning of surgery with A SIP OF WATER  antivert ( if needed), zofran or phenergan ( if needed), protonix.    Do not wear jewelry, make-up or nail polish.  Do not wear lotions, powders, or perfumes, or deodorant.  Do not shave 48 hours prior to surgery.  Men may shave face and neck.  Do not bring valuables to the hospital.  [facility] is not responsible for any belongings or valuables.  Contacts, dentures or bridgework may not be worn into surgery.  Leave your suitcase in the car.  After surgery it may be brought to your room.  For patients admitted to the hospital, discharge time will be determined by your treatment team.  Patients discharged the day of surgery will not be allowed to drive home.   Name and phone number of your driver:   family Special instructions:  None  Please read over the following fact sheets that you were given. Anesthesia Post-op Instructions and Care and Recovery After Surgery       Esophagogastroduodenoscopy Esophagogastroduodenoscopy (EGD) is a procedure to examine the lining of the esophagus, stomach, and first part of the small intestine (duodenum). This procedure is done to check for problems such as inflammation, bleeding, ulcers, or growths. During this procedure, a long, flexible, lighted tube with a camera attached (endoscope) is inserted down the throat. Tell a health care provider about:  Any allergies you have.  All medicines you are taking, including vitamins, herbs, eye drops, creams, and over-the-counter medicines.  Any problems you or family members have had with  anesthetic medicines.  Any blood disorders you have.  Any surgeries you have had.  Any medical conditions you have.  Whether you are pregnant or may be pregnant. What are the risks? Generally, this is a safe procedure. However, problems may occur, including:  Infection.  Bleeding.  A tear (perforation) in the esophagus, stomach, or duodenum.  Trouble breathing.  Excessive sweating.  Spasms of the larynx.  A slowed heartbeat.  Low blood pressure.  What happens before the procedure?  Follow instructions from your health care provider about eating or drinking restrictions.  Ask your health care provider about: ? Changing or stopping your regular medicines. This is especially important if you are taking diabetes medicines or blood thinners. ? Taking medicines such as aspirin and ibuprofen. These medicines can thin your blood. Do not take these medicines before your procedure if your health care provider instructs you not to.  Plan to have someone take you home after the procedure.  If you wear dentures, be ready to remove them before the procedure. What happens during the procedure?  To reduce your risk of infection, your health care team will wash or sanitize their hands.  An IV tube will be put in a vein in your hand or arm. You will get medicines and fluids through this tube.  You will be given one or more of the following: ?  A medicine to help you relax (sedative). ? A medicine to numb the area (local anesthetic). This medicine may be sprayed into your throat. It will make you feel more comfortable and keep you from gagging or coughing during the procedure. ? A medicine for pain.  A mouth guard may be placed in your mouth to protect your teeth and to keep you from biting on the endoscope.  You will be asked to lie on your left side.  The endoscope will be lowered down your throat into your esophagus, stomach, and duodenum.  Air will be put into the endoscope.  This will help your health care provider see better.  The lining of your esophagus, stomach, and duodenum will be examined.  Your health care provider may: ? Take a tissue sample so it can be looked at in a lab (biopsy). ? Remove growths. ? Remove objects (foreign bodies) that are stuck. ? Treat any bleeding with medicines or other devices that stop tissue from bleeding. ? Widen (dilate) or stretch narrowed areas of your esophagus and stomach.  The endoscope will be taken out. The procedure may vary among health care providers and hospitals. What happens after the procedure?  Your blood pressure, heart rate, breathing rate, and blood oxygen level will be monitored often until the medicines you were given have worn off.  Do not eat or drink anything until the numbing medicine has worn off and your gag reflex has returned. This information is not intended to replace advice given to you by your health care provider. Make sure you discuss any questions you have with your health care provider. Document Released: 12/09/2004 Document Revised: 01/14/2016 Document Reviewed: 07/02/2015 Elsevier Interactive Patient Education  2018 [employer]. Esophagogastroduodenoscopy, Care After Refer to this sheet in the next few weeks. These instructions provide you with information about caring for yourself after your procedure. Your health care provider may also give you more specific instructions. Your treatment has been planned according to current medical practices, but problems sometimes occur. Call your health care provider if you have any problems or questions after your procedure. What can I expect after the procedure? After the procedure, it is common to have:  A sore throat.  Nausea.  Bloating.  Dizziness.  Fatigue.  Follow these instructions at home:  Do not eat or drink anything until the numbing medicine (local anesthetic) has worn off and your gag reflex has returned. You will know  that the local anesthetic has worn off when you can swallow comfortably.  Do not drive for 24 hours if you received a medicine to help you relax (sedative).  If your health care provider took a tissue sample for testing during the procedure, make sure to get your test results. This is your responsibility. Ask your health care provider or the department performing the test when your results will be ready.  Keep all follow-up visits as told by your health care provider. This is important. Contact a health care provider if:  You cannot stop coughing.  You are not urinating.  You are urinating less than usual. Get help right away if:  You have trouble swallowing.  You cannot eat or drink.  You have throat or chest pain that gets worse.  You are dizzy or light-headed.  You faint.  You have nausea or vomiting.  You have chills.  You have a fever.  You have severe abdominal pain.  You have black, tarry, or bloody stools. This information is not intended to  replace advice given to you by your health care provider. Make sure you discuss any questions you have with your health care provider. Document Released: 07/25/2012 Document Revised: 01/14/2016 Document Reviewed: 07/02/2015 Elsevier Interactive Patient Education  2018 [employer]  Monitored Anesthesia Care Anesthesia is a term that refers to techniques, procedures, and medicines that help a person stay safe and comfortable during a medical procedure. Monitored anesthesia care, or sedation, is one type of anesthesia. Your anesthesia specialist may recommend sedation if you will be having a procedure that does not require you to be unconscious, such as:  Cataract surgery.  A dental procedure.  A biopsy.  A colonoscopy.  During the procedure, you may receive a medicine to help you relax (sedative). There are three levels of sedation:  Mild sedation. At this level, you may feel awake and relaxed. You will be able to  follow directions.  Moderate sedation. At this level, you will be sleepy. You may not remember the procedure.  Deep sedation. At this level, you will be asleep. You will not remember the procedure.  The more medicine you are given, the deeper your level of sedation will be. Depending on how you respond to the procedure, the anesthesia specialist may change your level of sedation or the type of anesthesia to fit your needs. An anesthesia specialist will monitor you closely during the procedure. Let your health care provider know about:  Any allergies you have.  All medicines you are taking, including vitamins, herbs, eye drops, creams, and over-the-counter medicines.  Any use of steroids (by mouth or as a cream).  Any problems you or family members have had with sedatives and anesthetic medicines.  Any blood disorders you have.  Any surgeries you have had.  Any medical conditions you have, such as sleep apnea.  Whether you are pregnant or may be pregnant.  Any use of cigarettes, alcohol, or street drugs. What are the risks? Generally, this is a safe procedure. However, problems may occur, including:  Getting too much medicine (oversedation).  Nausea.  Allergic reaction to medicines.  Trouble breathing. If this happens, a breathing tube may be used to help with breathing. It will be removed when you are awake and breathing on your own.  Heart trouble.  Lung trouble.  Before the procedure Staying hydrated Follow instructions from your health care provider about hydration, which may include:  Up to 2 hours before the procedure - you may continue to drink clear liquids, such as water, clear fruit juice, black coffee, and plain tea.  Eating and drinking restrictions Follow instructions from your health care provider about eating and drinking, which may include:  8 hours before the procedure - stop eating heavy meals or foods such as meat, fried foods, or fatty foods.  6  hours before the procedure - stop eating light meals or foods, such as toast or cereal.  6 hours before the procedure - stop drinking milk or drinks that contain milk.  2 hours before the procedure - stop drinking clear liquids.  Medicines Ask your health care provider about:  Changing or stopping your regular medicines. This is especially important if you are taking diabetes medicines or blood thinners.  Taking medicines such as aspirin and ibuprofen. These medicines can thin your blood. Do not take these medicines before your procedure if your health care provider instructs you not to.  Tests and exams  You will have a physical exam.  You may have blood tests done to show: ?  How well your kidneys and liver are working. ? How well your blood can clot.  General instructions  Plan to have someone take you home from the hospital or clinic.  If you will be going home right after the procedure, plan to have someone with you for 24 hours.  What happens during the procedure?  Your blood pressure, heart rate, breathing, level of pain and overall condition will be monitored.  An IV tube will be inserted into one of your veins.  Your anesthesia specialist will give you medicines as needed to keep you comfortable during the procedure. This may mean changing the level of sedation.  The procedure will be performed. After the procedure  Your blood pressure, heart rate, breathing rate, and blood oxygen level will be monitored until the medicines you were given have worn off.  Do not drive for 24 hours if you received a sedative.  You may: ? Feel sleepy, clumsy, or nauseous. ? Feel forgetful about what happened after the procedure. ? Have a sore throat if you had a breathing tube during the procedure. ? Vomit. This information is not intended to replace advice given to you by your health care provider. Make sure you discuss any questions you have with your health care  provider. Document Released: 05/04/2005 Document Revised: 01/15/2016 Document Reviewed: 11/29/2015 Elsevier Interactive Patient Education  2018 [employer] Monitored Anesthesia Care, Care After These instructions provide you with information about caring for yourself after your procedure. Your health care provider may also give you more specific instructions. Your treatment has been planned according to current medical practices, but problems sometimes occur. Call your health care provider if you have any problems or questions after your procedure. What can I expect after the procedure? After your procedure, it is common to:  Feel sleepy for several hours.  Feel clumsy and have poor balance for several hours.  Feel forgetful about what happened after the procedure.  Have poor judgment for several hours.  Feel nauseous or vomit.  Have a sore throat if you had a breathing tube during the procedure.  Follow these instructions at home: For at least 24 hours after the procedure:   Do not: ? Participate in activities in which you could fall or become injured. ? Drive. ? Use heavy machinery. ? Drink alcohol. ? Take sleeping pills or medicines that cause drowsiness. ? Make important decisions or sign legal documents. ? Take care of children on your own.  Rest. Eating and drinking  Follow the diet that is recommended by your health care provider.  If you vomit, drink water, juice, or soup when you can drink without vomiting.  Make sure you have little or no nausea before eating solid foods. General instructions  Have a responsible adult stay with you until you are awake and alert.  Take over-the-counter and prescription medicines only as told by your health care provider.  If you smoke, do not smoke without supervision.  Keep all follow-up visits as told by your health care provider. This is important. Contact a health care provider if:  You keep feeling nauseous or you  keep vomiting.  You feel light-headed.  You develop a rash.  You have a fever. Get help right away if:  You have trouble breathing. This information is not intended to replace advice given to you by your health care provider. Make sure you discuss any questions you have with your health care provider. Document Released: 11/29/2015 Document Revised: 03/30/2016 Document Reviewed: 11/29/2015  Elsevier Interactive Patient Education  2018 [employer] Inc[employer]  

## 2018-07-30 NOTE — Telephone Encounter (Signed)
Called [employer] to expedite the case. Spoke with Shawn. Ref# [id number]. He reports to call back in 3-4 hrs for decision.

## 2018-07-31 ENCOUNTER — Ambulatory Visit (HOSPITAL_COMMUNITY): Payer: 59 | Admitting: Anesthesiology

## 2018-07-31 ENCOUNTER — Ambulatory Visit (HOSPITAL_COMMUNITY)
Admission: RE | Admit: 2018-07-31 | Discharge: 2018-07-31 | Disposition: A | Discharge status: Home / Self Care | Payer: 59 | Source: Ambulatory Visit | Attending: Gastroenterology | Admitting: Gastroenterology

## 2018-07-31 ENCOUNTER — Encounter (HOSPITAL_COMMUNITY): Payer: Self-pay

## 2018-07-31 ENCOUNTER — Encounter (HOSPITAL_COMMUNITY)
Admission: RE | Disposition: A | Discharge status: Home / Self Care | Payer: Self-pay | Source: Ambulatory Visit | Attending: Gastroenterology

## 2018-07-31 DIAGNOSIS — K2970 Gastritis, unspecified, without bleeding: Secondary | ICD-10-CM | POA: Diagnosis not present

## 2018-07-31 DIAGNOSIS — K2980 Duodenitis without bleeding: Secondary | ICD-10-CM | POA: Insufficient documentation

## 2018-07-31 DIAGNOSIS — R1310 Dysphagia, unspecified: Secondary | ICD-10-CM

## 2018-07-31 DIAGNOSIS — K219 Gastro-esophageal reflux disease without esophagitis: Secondary | ICD-10-CM | POA: Diagnosis not present

## 2018-07-31 DIAGNOSIS — K222 Esophageal obstruction: Secondary | ICD-10-CM | POA: Insufficient documentation

## 2018-07-31 DIAGNOSIS — Z888 Allergy status to other drugs, medicaments and biological substances status: Secondary | ICD-10-CM | POA: Insufficient documentation

## 2018-07-31 DIAGNOSIS — Z79899 Other long term (current) drug therapy: Secondary | ICD-10-CM | POA: Insufficient documentation

## 2018-07-31 DIAGNOSIS — K449 Diaphragmatic hernia without obstruction or gangrene: Secondary | ICD-10-CM

## 2018-07-31 DIAGNOSIS — R1013 Epigastric pain: Secondary | ICD-10-CM

## 2018-07-31 HISTORY — PX: BIOPSY: SHX5522

## 2018-07-31 HISTORY — PX: ESOPHAGOGASTRODUODENOSCOPY (EGD) WITH PROPOFOL: SHX5813

## 2018-07-31 SURGERY — ESOPHAGOGASTRODUODENOSCOPY (EGD) WITH PROPOFOL
Anesthesia: General

## 2018-07-31 MED ORDER — LACTATED RINGERS IV SOLN
INTRAVENOUS | Status: DC
  Administered 2018-07-31: 08:00:00 via INTRAVENOUS

## 2018-07-31 MED ORDER — PROMETHAZINE HCL 25 MG/ML IJ SOLN
INTRAMUSCULAR | Status: AC
  Filled 2018-07-31: qty 1

## 2018-07-31 MED ORDER — PROPOFOL 10 MG/ML IV BOLUS
INTRAVENOUS | Status: AC
  Filled 2018-07-31: qty 40

## 2018-07-31 MED ORDER — GLYCOPYRROLATE 0.2 MG/ML IJ SOLN
INTRAMUSCULAR | Status: DC | PRN
  Administered 2018-07-31: 0.2 mg via INTRAVENOUS

## 2018-07-31 MED ORDER — HYDROCODONE-ACETAMINOPHEN 7.5-325 MG PO TABS: 1.0000 | ORAL_TABLET | Freq: Once | ORAL | Status: DC | PRN

## 2018-07-31 MED ORDER — PROPOFOL 10 MG/ML IV BOLUS
INTRAVENOUS | Status: DC | PRN
  Administered 2018-07-31: 30 mg via INTRAVENOUS

## 2018-07-31 MED ORDER — MINERAL OIL PO OIL
TOPICAL_OIL | ORAL | Status: AC
  Filled 2018-07-31: qty 30

## 2018-07-31 MED ORDER — CHLORHEXIDINE GLUCONATE CLOTH 2 % EX PADS: 6.0000 | MEDICATED_PAD | Freq: Once | CUTANEOUS | Status: DC

## 2018-07-31 MED ORDER — LIDOCAINE VISCOUS HCL 2 % MT SOLN
OROMUCOSAL | Status: AC
  Filled 2018-07-31: qty 15

## 2018-07-31 MED ORDER — LIDOCAINE HCL (CARDIAC) PF 100 MG/5ML IV SOSY
PREFILLED_SYRINGE | INTRAVENOUS | Status: DC | PRN
  Administered 2018-07-31: 20 mg via INTRAVENOUS

## 2018-07-31 MED ORDER — MIDAZOLAM HCL 2 MG/2ML IJ SOLN: 0.5000 mg | Freq: Once | INTRAMUSCULAR | Status: DC | PRN

## 2018-07-31 MED ORDER — MIDAZOLAM HCL 2 MG/2ML IJ SOLN
INTRAMUSCULAR | Status: AC
  Filled 2018-07-31: qty 2

## 2018-07-31 MED ORDER — SODIUM CHLORIDE 0.9% FLUSH
INTRAVENOUS | Status: AC
  Filled 2018-07-31: qty 10

## 2018-07-31 MED ORDER — LIDOCAINE VISCOUS HCL 2 % MT SOLN
10.0000 mL | Freq: Once | OROMUCOSAL | Status: AC
  Administered 2018-07-31: 10 mL via OROMUCOSAL

## 2018-07-31 MED ORDER — STERILE WATER FOR IRRIGATION IR SOLN
Status: DC | PRN
  Administered 2018-07-31: 100 mL

## 2018-07-31 MED ORDER — PROPOFOL 500 MG/50ML IV EMUL
INTRAVENOUS | Status: DC | PRN
  Administered 2018-07-31: 150 ug/kg/min via INTRAVENOUS

## 2018-07-31 MED ORDER — HYDROMORPHONE HCL 1 MG/ML IJ SOLN: 0.2500 mg | INTRAMUSCULAR | Status: DC | PRN

## 2018-07-31 MED ORDER — PROMETHAZINE HCL 25 MG/ML IJ SOLN
6.2500 mg | INTRAMUSCULAR | Status: DC | PRN
  Administered 2018-07-31: 6.25 mg via INTRAVENOUS

## 2018-07-31 NOTE — Anesthesia Procedure Notes (Signed)
Procedure Name: General with mask airway Date/Time: 07/31/2018 8:29 AM Performed by: Pernell DupreAdams, Adore Kithcart A, CRNA Pre-anesthesia Checklist: Patient identified, Emergency Drugs available, Suction available, Patient being monitored and Timeout performed Oxygen Delivery Method: Non-rebreather mask

## 2018-07-31 NOTE — Transfer of Care (Signed)
Immediate Anesthesia Transfer of Care Note  Patient: Katie CrawfordJessica Y Pozzi  Procedure(s) Performed: ESOPHAGOGASTRODUODENOSCOPY (EGD) WITH PROPOFOL (N/A ) BIOPSY  Patient Location: PACU  Anesthesia Type:General  Level of Consciousness: awake, oriented and patient cooperative  Airway & Oxygen Therapy: Patient Spontanous Breathing  Post-op Assessment: Report given to RN and Post -op Vital signs reviewed and stable  Post vital signs: Reviewed and stable  Last Vitals:  Vitals Value Taken Time  BP 111/83 07/31/2018  8:56 AM  Temp    Pulse 101 07/31/2018  8:59 AM  Resp 23 07/31/2018  8:59 AM  SpO2 99 % 07/31/2018  8:59 AM  Vitals shown include unvalidated device data.  Last Pain:  Vitals:   07/31/18 0852  TempSrc:   PainSc: 0-No pain         Complications: No apparent anesthesia complications

## 2018-07-31 NOTE — Anesthesia Postprocedure Evaluation (Signed)
Anesthesia Post Note  Patient: Dia CrawfordJessica Y Sivley  Procedure(s) Performed: ESOPHAGOGASTRODUODENOSCOPY (EGD) WITH PROPOFOL (N/A ) BIOPSY  Patient location during evaluation: PACU Anesthesia Type: General Level of consciousness: awake and alert and oriented Pain management: pain level controlled Vital Signs Assessment: post-procedure vital signs reviewed and stable Respiratory status: spontaneous breathing Cardiovascular status: stable Postop Assessment: no apparent nausea or vomiting Anesthetic complications: no     Last Vitals:  Vitals:   07/31/18 0735 07/31/18 0856  BP: 122/90 111/83  Pulse: (!) 105   Resp: 18 20  Temp: 36.5 C 36.9 C  SpO2: 96% 99%    Last Pain:  Vitals:   07/31/18 0856  TempSrc:   PainSc: (P) Asleep                 Epsie Walthall A

## 2018-07-31 NOTE — Discharge Instructions (Signed)
I dilated your esophagus DUE TO YOUR PROBLEM with nausea/vomiting. You have MILD gastritis. NO OBVIOUS SOURCE FOR YOUR NAUSEA/VOMITING WAS IDENTIFIED. I biopsied your stomach AND SMALL BOWEL.   DRINK WATER TO KEEP YOUR URINE LIGHT YELLOW.  FOLLOW A LOW FAT DIET. MEATS SHOULD BE BAKED, BROILED, OR BOILED. AVOID FRIED FOODS. SEE INFO BELOW.   TO CONTROL NAUSEA/VOMITING:    1. CONTINUE PROTONIX. TAKE 30 MINUTES PRIOR TO MEALS TWICE DAILY.    2. TO CONTROL NAUSEA/VOMITING, TAKE PHENERGAN 30 MINS PRIOR TO MEALS THREE TIMES A DAY AND IF NEEDED.  FOLLOW A LOW FAT DIET. SEE INFO BELOW.   YOUR BIOPSY RESULTS WILL BE BACK IN 5 BUSINESS DAYS.  FOLLOW UP IN APR 2020.  UPPER ENDOSCOPY AFTER CARE Read the instructions outlined below and refer to this sheet in the next week. These discharge instructions provide you with general information on caring for yourself after you leave the hospital. While your treatment has been planned according to the most current medical practices available, unavoidable complications occasionally occur. If you have any problems or questions after discharge, call DR. Jozlyn Schatz, [phone].  ACTIVITY  You may resume your regular activity, but move at a slower pace for the next 24 hours.   Take frequent rest periods for the next 24 hours.   Walking will help get rid of the air and reduce the bloated feeling in your belly (abdomen).   No driving for 24 hours (because of the medicine (anesthesia) used during the test).   You may shower.   Do not sign any important legal documents or operate any machinery for 24 hours (because of the anesthesia used during the test).    NUTRITION  Drink plenty of fluids.   You may resume your normal diet as instructed by your doctor.   Begin with a light meal and progress to your normal diet. Heavy or fried foods are harder to digest and may make you feel sick to your stomach (nauseated).   Avoid alcoholic beverages for 24 hours  or as instructed.    MEDICATIONS  You may resume your normal medications.   WHAT YOU CAN EXPECT TODAY  Some feelings of bloating in the abdomen.   Passage of more gas than usual.    IF YOU HAD A BIOPSY TAKEN DURING THE UPPER ENDOSCOPY:  No aspirin products for 14 days.   Eat a soft diet IF YOU HAVE NAUSEA, BLOATING, ABDOMINAL PAIN, OR VOMITING.    FINDING OUT THE RESULTS OF YOUR TEST Not all test results are available during your visit. DR. Darrick Penna WILL CALL YOU WITHIN 7 DAYS OF YOUR PROCEDUE WITH YOUR RESULTS. Do not assume everything is normal if you have not heard from DR. Rainey Rodger IN ONE WEEK, CALL HER OFFICE AT [phone].  SEEK IMMEDIATE MEDICAL ATTENTION AND CALL THE OFFICE: [phone] IF:  You have more than a spotting of blood in your stool.   Your belly is swollen (abdominal distention).   You are nauseated or vomiting.   You have a temperature over 101F.   You have abdominal pain or discomfort that is severe or gets worse throughout the day.  Gastritis  Gastritis is an inflammation (the body's way of reacting to injury and/or infection) of the stomach. It is often caused by viral or bacterial (germ) infections. It can also be caused BY ASPIRIN, BC/GOODY POWDER'S, (IBUPROFEN) MOTRIN, OR ALEVE (NAPROXEN), chemicals (including alcohol), SPICY FOODS, and medications. This illness may be associated with generalized malaise (feeling tired, not  well), UPPER ABDOMINAL STOMACH cramps, and fever. One common bacterial cause of gastritis is an organism known as H. Pylori. This can be treated with antibiotics.    Low-Fat Diet BREADS, CEREALS, PASTA, RICE, DRIED PEAS, AND BEANS These products are high in carbohydrates and most are low in fat. Therefore, they can be increased in the diet as substitutes for fatty foods. They too, however, contain calories and should not be eaten in excess. Cereals can be eaten for snacks as well as for breakfast.   FRUITS AND VEGETABLES It  is good to eat fruits and vegetables. Besides being sources of fiber, both are rich in vitamins and some minerals. They help you get the daily allowances of these nutrients. Fruits and vegetables can be used for snacks and desserts.  MEATS Limit lean meat, chicken, [place], and fish to no more than 6 ounces per day. Beef, Pork, and Lamb Use lean cuts of beef, pork, and lamb. Lean cuts include:  Extra-lean ground beef.  Arm roast.  Sirloin tip.  Center-cut ham.  Round steak.  Loin chops.  Rump roast.  Tenderloin.  Trim all fat off the outside of meats before cooking. It is not necessary to severely decrease the intake of red meat, but lean choices should be made. Lean meat is rich in protein and contains a highly absorbable form of iron. Premenopausal women, in particular, should avoid reducing lean red meat because this could increase the risk for low red blood cells (iron-deficiency anemia).  Chicken and [place] These are good sources of protein. The fat of poultry can be reduced by removing the skin and underlying fat layers before cooking. Chicken and [place] can be substituted for lean red meat in the diet. Poultry should not be fried or covered with high-fat sauces. Fish and Shellfish Fish is a good source of protein. Shellfish contain cholesterol, but they usually are low in saturated fatty acids. The preparation of fish is important. Like chicken and [place], they should not be fried or covered with high-fat sauces. EGGS Egg whites contain no fat or cholesterol. They can be eaten often. Try 1 to 2 egg whites instead of whole eggs in recipes or use egg substitutes that do not contain yolk. MILK AND DAIRY PRODUCTS Use skim or 1% milk instead of 2% or whole milk. Decrease whole milk, natural, and processed cheeses. Use nonfat or low-fat (2%) cottage cheese or low-fat cheeses made from vegetable oils. Choose nonfat or low-fat (1 to 2%) yogurt. Experiment with evaporated skim milk in recipes  that call for heavy cream. Substitute low-fat yogurt or low-fat cottage cheese for sour cream in dips and salad dressings. Have at least 2 servings of low-fat dairy products, such as 2 glasses of skim (or 1%) milk each day to help get your daily calcium intake. FATS AND OILS Reduce the total intake of fats, especially saturated fat. Butterfat, lard, and beef fats are high in saturated fat and cholesterol. These should be avoided as much as possible. Vegetable fats do not contain cholesterol, but certain vegetable fats, such as coconut oil, palm oil, and palm kernel oil are very high in saturated fats. These should be limited. These fats are often used in bakery goods, processed foods, popcorn, oils, and nondairy creamers. Vegetable shortenings and some peanut butters contain hydrogenated oils, which are also saturated fats. Read the labels on these foods and check for saturated vegetable oils. Unsaturated vegetable oils and fats do not raise blood cholesterol. However, they should be limited because  they are fats and are high in calories. Total fat should still be limited to 30% of your daily caloric intake. Desirable liquid vegetable oils are corn oil, cottonseed oil, olive oil, canola oil, safflower oil, soybean oil, and sunflower oil. Peanut oil is not as good, but small amounts are acceptable. Buy a heart-healthy tub margarine that has no partially hydrogenated oils in the ingredients. Mayonnaise and salad dressings often are made from unsaturated fats, but they should also be limited because of their high calorie and fat content. Seeds, nuts, peanut butter, olives, and avocados are high in fat, but the fat is mainly the unsaturated type. These foods should be limited mainly to avoid excess calories and fat. OTHER EATING TIPS Snacks  Most sweets should be limited as snacks. They tend to be rich in calories and fats, and their caloric content outweighs their nutritional value. Some good choices in snacks  are graham crackers, melba toast, soda crackers, bagels (no egg), English muffins, fruits, and vegetables. These snacks are preferable to snack crackers, [place] fries, TORTILLA CHIPS, and POTATO chips. Popcorn should be air-popped or cooked in small amounts of liquid vegetable oil. Desserts Eat fruit, low-fat yogurt, and fruit ices instead of pastries, cake, and cookies. Sherbet, angel food cake, gelatin dessert, frozen low-fat yogurt, or other frozen products that do not contain saturated fat (pure fruit juice bars, frozen ice pops) are also acceptable.  COOKING METHODS Choose those methods that use little or no fat. They include: Poaching.  Braising.  Steaming.  Grilling.  Baking.  Stir-frying.  Broiling.  Microwaving.  Foods can be cooked in a nonstick pan without added fat, or use a nonfat cooking spray in regular cookware. Limit fried foods and avoid frying in saturated fat. Add moisture to lean meats by using water, broth, cooking wines, and other nonfat or low-fat sauces along with the cooking methods mentioned above. Soups and stews should be chilled after cooking. The fat that forms on top after a few hours in the refrigerator should be skimmed off. When preparing meals, avoid using excess salt. Salt can contribute to raising blood pressure in some people.  EATING AWAY FROM HOME Order entres, potatoes, and vegetables without sauces or butter. When meat exceeds the size of a deck of cards (3 to 4 ounces), the rest can be taken home for another meal. Choose vegetable or fruit salads and ask for low-calorie salad dressings to be served on the side. Use dressings sparingly. Limit high-fat toppings, such as bacon, crumbled eggs, cheese, sunflower seeds, and olives. Ask for heart-healthy tub margarine instead of butter.

## 2018-07-31 NOTE — Interval H&P Note (Signed)
History and Physical Interval Note:  07/31/2018 8:22 AM  Katie CrawfordJessica Y Lacasse  has presented today for surgery, with the diagnosis of dyspepsia  The various methods of treatment have been discussed with the patient and family. After consideration of risks, benefits and other options for treatment, the patient has consented to  Procedure(s) with comments: ESOPHAGOGASTRODUODENOSCOPY (EGD) WITH PROPOFOL (N/A) - 8:45am as a surgical intervention .  The patient's history has been reviewed, patient examined, no change in status, stable for surgery.  I have reviewed the patient's chart and labs.  Questions were answered to the patient's satisfaction.     [employer]aton CorporationSandi Carrissa Taitano

## 2018-07-31 NOTE — Anesthesia Preprocedure Evaluation (Addendum)
Anesthesia Evaluation  Patient identified by MRN, date of birth, ID band Patient awake    Reviewed: Allergy & Precautions, NPO status , Patient's Chart, lab work & pertinent test results  Airway Mallampati: II  TM Distance: >3 FB Neck ROM: Full    Dental no notable dental hx. (+) Teeth Intact   Pulmonary neg pulmonary ROS,    Pulmonary exam normal breath sounds clear to auscultation       Cardiovascular Exercise Tolerance: Good negative cardio ROS Normal cardiovascular examI Rhythm:Regular Rate:Normal     Neuro/Psych negative neurological ROS  negative psych ROS   GI/Hepatic Neg liver ROS, GERD  Medicated and Controlled,  Endo/Other  negative endocrine ROS  Renal/GU negative Renal ROS  negative genitourinary   Musculoskeletal negative musculoskeletal ROS (+)   Abdominal   Peds negative pediatric ROS (+)  Hematology negative hematology ROS (+)   Anesthesia Other Findings States lost ~25 lbs unintentionally  Denies GERD Sx today    Reproductive/Obstetrics negative OB ROS                            Anesthesia Physical Anesthesia Plan  ASA: II  Anesthesia Plan: General   Post-op Pain Management:    Induction: Intravenous  PONV Risk Score and Plan:   Airway Management Planned: Nasal Cannula  Additional Equipment:   Intra-op Plan:   Post-operative Plan:   Informed Consent: I have reviewed the patients History and Physical, chart, labs and discussed the procedure including the risks, benefits and alternatives for the proposed anesthesia with the patient or authorized representative who has indicated his/her understanding and acceptance.   Dental advisory given  Plan Discussed with: CRNA  Anesthesia Plan Comments:         Anesthesia Quick Evaluation

## 2018-07-31 NOTE — Op Note (Signed)
[facility] Patient Name: Katie Blankenship Procedure Date: 07/31/2018 8:22 AM MRN: [id number] Date of Birth: 08/10/1981 Attending MD: Jonette Eva MD, MD CSN: [id number] Age: 37 Admit Type: Outpatient Procedure:                Upper GI endoscopy WITH COLD FORCEPS BIOPSY Indications:              Dyspepsia, Esophageal reflux symptoms that persist                            despite appropriate therapy, Nausea with vomiting Providers:                Jonette Eva MD, MD, Sterling Big, RN, Dyann Ruddle Referring MD:             Lucita Lora. Daniel MD, MD Medicines:                Propofol per Anesthesia Complications:            No immediate complications. Estimated Blood Loss:     Estimated blood loss was minimal. Procedure:                Pre-Anesthesia Assessment:                           - Prior to the procedure, a History and Physical                            was performed, and patient medications and                            allergies were reviewed. The patient's tolerance of                            previous anesthesia was also reviewed. The risks                            and benefits of the procedure and the sedation                            options and risks were discussed with the patient.                            All questions were answered, and informed consent                            was obtained. Prior Anticoagulants: The patient has                            taken no previous anticoagulant or antiplatelet                            agents. ASA Grade Assessment: II - A patient with  mild systemic disease. After reviewing the risks                            and benefits, the patient was deemed in                            satisfactory condition to undergo the procedure.                            After obtaining informed consent, the endoscope was                            passed under direct vision.  Throughout the                            procedure, the patient's blood pressure, pulse, and                            oxygen saturations were monitored continuously. The                            [id number]-[id number] ([id number] scope was introduced through the                            mouth, and advanced to the second part of duodenum.                            The upper GI endoscopy was accomplished without                            difficulty. The patient tolerated the procedure                            well. Scope In: 8:39:29 AM Scope Out: 8:50:14 AM Total Procedure Duration: 0 hours 10 minutes 45 seconds  Findings:      No endoscopic abnormality was evident in the esophagus to explain the       patient's complaint of PERSISTENT NAUSEA/VOMTIING/HEARTBURN. A guidewire       was placed and the scope was withdrawn. Dilation was performed with a       Savary dilator with mild resistance at 15 mm, 16 mm and 17 mm DUE TO       POSSIBLE PROXIMAL ESOPHAGEAL STRICTURE. Estimated blood loss was minimal.      A small hiatal hernia was present.      Patchy mild inflammation characterized by congestion (edema) and       erythema was found in the gastric fundus and in the gastric antrum.       Biopsies(2: BODY, 3: ANTRUM) were taken with a cold forceps for       Helicobacter pylori testing.      The duodenal bulb was normal. Biopsies(2) for histology were taken with       a cold forceps for evaluation of celiac disease.      The second portion of the duodenum was normal. Biopsies(4) for histology       were taken with a  cold forceps for evaluation of celiac disease. Impression:               - No OBVIOUS SOURCE FOR NAUSEA/VOMITING/HEARTBURN                            IDENTIFIED.                           - Small hiatal hernia.                           - MILD Gastritis. Biopsied. Moderate Sedation:      Per Anesthesia Care Recommendation:           - Patient has a contact number available for                             emergencies. The signs and symptoms of potential                            delayed complications were discussed with the                            patient. Return to normal activities tomorrow.                            Written discharge instructions were provided to the                            patient.                           - Low fat diet.                           - Continue present medications.                           - Await pathology results.                           - Return to my office in 4 months. Procedure Code(s):        --- Professional ---                           [id number], Esophagogastroduodenoscopy, flexible,                            transoral; with insertion of guide wire followed by                            passage of dilator(s) through esophagus over guide                            wire                           43239, 59, Esophagogastroduodenoscopy, flexible,  transoral; with biopsy, single or multiple Diagnosis Code(s):        --- Professional ---                           R13.10, Dysphagia, unspecified                           K44.9, Diaphragmatic hernia without obstruction or                            gangrene                           K29.70, Gastritis, unspecified, without bleeding                           R10.13, Epigastric pain                           K21.9, Gastro-esophageal reflux disease without                            esophagitis                           R11.2, Nausea with vomiting, unspecified CPT copyright 2018 American Medical Association. All rights reserved. The codes documented in this report are preliminary and upon coder review may  be revised to meet current compliance requirements. Jonette Eva, MD Jonette Eva MD, MD 07/31/2018 9:22:25 AM This report has been signed electronically. Number of Addenda: 0

## 2018-08-01 ENCOUNTER — Telehealth: Payer: Self-pay | Admitting: Gastroenterology

## 2018-08-01 DIAGNOSIS — R7989 Other specified abnormal findings of blood chemistry: Secondary | ICD-10-CM

## 2018-08-01 DIAGNOSIS — E270 Other adrenocortical overactivity: Principal | ICD-10-CM

## 2018-08-01 MED ORDER — POTASSIUM CHLORIDE ER 20 MEQ PO TBCR: EXTENDED_RELEASE_TABLET | ORAL | 0 refills | Status: DC

## 2018-08-01 NOTE — Telephone Encounter (Signed)
PLEASE CALL PT. SHE HAS A LOW POTASSIUM. RX SENT FOR KCL 40 mEq BID FOR 3 DAYS. HER LIVER SEROLOGIES ARE NEGATIVE.  HER CORTISOL IS ELEVATED.   I SPOKE TO DR. NIDA, ENDOCRINOLOGY. SHE NEEDS TO SEE DR. NIDA TO EVALUATE THIS ELEVATED LEVEL OF CORTISOL. SHE WILL NEED ADDITIONAL TESTING TO SEE IF THIS NUMBER IS CLINICALLY SIGNIFICANT. THIS DOES NOT EXPLAIN HER NAUSEA/VOMTING. WE ARE WAITING ON HER PATH REPORT.

## 2018-08-01 NOTE — Telephone Encounter (Signed)
LMOM to call.

## 2018-08-02 NOTE — Telephone Encounter (Signed)
Pt notified of results.  [facility]- Pt will need a referral to Dr. Isidoro DonningNida's office per Dr. Darrick PennaFields.

## 2018-08-02 NOTE — Telephone Encounter (Signed)
Referral sent to Dr. Fransico HimNida via Epic.

## 2018-08-02 NOTE — Addendum Note (Signed)
Addended by: Sandria SenterBOOTH, Hamlet Lasecki C on: 08/02/2018 10:30 AM   Modules accepted: Orders

## 2018-08-03 ENCOUNTER — Telehealth: Payer: Self-pay | Admitting: Gastroenterology

## 2018-08-03 ENCOUNTER — Encounter (HOSPITAL_COMMUNITY): Payer: Self-pay | Admitting: Gastroenterology

## 2018-08-03 NOTE — Telephone Encounter (Signed)
PATIENT DROPPED OFF FMLA FORMS AND I PUT THEM IN YOUR OFFICE CHAIR

## 2018-08-06 ENCOUNTER — Telehealth: Payer: Self-pay | Admitting: Gastroenterology

## 2018-08-06 DIAGNOSIS — R112 Nausea with vomiting, unspecified: Secondary | ICD-10-CM

## 2018-08-06 NOTE — Telephone Encounter (Signed)
  Please call pt. HER stomach Bx ARE NORMAL. HER SMALL BOWEL BIOPSIES SHOW MILD DUODENITIS. NO SOURCE FOR HER NAUSEA/VOMTIING WAS IDENTIFIED.    DRINK WATER TO KEEP YOUR URINE LIGHT YELLOW.  FOLLOW A LOW FAT DIET. MEATS SHOULD BE BAKED, BROILED, OR BOILED. AVOID FRIED FOODS.   TO CONTROL NAUSEA/VOMITING:    1. CONTINUE PROTONIX. TAKE 30 MINUTES PRIOR TO MEALS TWICE DAILY.    2. TO CONTROL NAUSEA/VOMITING, TAKE PHENERGAN 30 MINS PRIOR TO MEALS THREE TIMES A DAY AND IF NEEDED.  COMPLETE GASTRIC EMPTYING STUDY WITHIN THE NEXT 7 DAYS, Dx: NAUSEA/VOMITING.. THE STUDY TAKES 4-5 HOURS.  FOLLOW UP IN APR 2020.

## 2018-08-06 NOTE — Telephone Encounter (Signed)
PATIENT SCHEDULED  °

## 2018-08-06 NOTE — Addendum Note (Signed)
Addended by: Tommie SamsSILVA, MINDY S on: 08/06/2018 02:29 PM   Modules accepted: Orders

## 2018-08-06 NOTE — Telephone Encounter (Signed)
Called nuc med. Received VM. Will call back  [facility] Auth# [id number] Dates 08/06/18-09/20/18.

## 2018-08-06 NOTE — Telephone Encounter (Signed)
ges scheduled for 08/10/18, arrival time 7:45am, npo after midnight, no stomach medications after midnight.  LMTCB for pt

## 2018-08-06 NOTE — Telephone Encounter (Signed)
Pt is aware and it is OK to schedule the GES.

## 2018-08-07 NOTE — Telephone Encounter (Signed)
Patient aware of appt details. She voiced understanding

## 2018-08-10 ENCOUNTER — Encounter (HOSPITAL_COMMUNITY): Admission: RE | Admit: 2018-08-10 | Payer: 59 | Source: Ambulatory Visit

## 2018-08-14 NOTE — Telephone Encounter (Addendum)
PT DID NOT COMPLETE GES. FMLA [employer] COMPLETED DEC 24 FOR OCT 2019-MAR 2020; 1 DAY OFF Q2 WKS.

## 2018-08-17 ENCOUNTER — Telehealth: Payer: Self-pay | Admitting: Gastroenterology

## 2018-08-17 NOTE — Telephone Encounter (Signed)
SEE PRIOR TC. 

## 2018-08-17 NOTE — Telephone Encounter (Signed)
REVIEWED-NO ADDITIONAL RECOMMENDATIONS. 

## 2018-08-17 NOTE — Telephone Encounter (Signed)
Paper work was given to BallardSusan to give pt. Paper work was left on my desk.

## 2018-08-17 NOTE — Telephone Encounter (Signed)
Pt called asking if her FMLA papers where done yet. She said that she dropped off the forms weeks ago and her job is depending on getting these forms back ASAP. I told her that I would need to get with SF and find out if she has the forms with her and if they have been completed yet. I could not locate them on [facility]. Please advise if you have the FMLA on patient and I will call her back on Monday.

## 2018-08-17 NOTE — Telephone Encounter (Signed)
PLEASE CALL PT. She dropped off paperwork on DEC 13. I WAS WAITING ON HER TO COMPLETE HER GES ON DEC 20 BEFORE COMPLETING THEM. HOWEVER I WENT AHEAD AND COMPLETED THE PAPERWORK ON DEC 24 AND IT IS GOOD FOR 3 MOS.  IF SHE DESIRES AN EXTENSION ON HER FMLA PAPERWORK SHE NEEDS TO COMPLETE GES.

## 2018-08-17 NOTE — Telephone Encounter (Signed)
Pt is aware and will try to be here before noon to pick them up. She is scheduled her GES for 1/9

## 2018-08-21 ENCOUNTER — Telehealth: Payer: Self-pay | Admitting: Gastroenterology

## 2018-08-21 NOTE — Telephone Encounter (Signed)
[place] with [facility] called this afternoon wanting clarification on a few questions from patient's FMLA forms that I faxed to them on Friday. I told him that SF wasn't in the office and if he could fax what he needed that I would get it to [facility]. He faxed the forms and [facility]. He did say that they would need them back no later than January 12th.

## 2018-08-21 NOTE — Telephone Encounter (Signed)
Pt called shortly after Katie Blankenship and was saying they needed dates, times and duration of her illness. I told her that Katie Blankenship had already called and faxed us the papers for clarification and [facility].

## 2018-08-23 NOTE — Telephone Encounter (Signed)
Forms completed by [facility] and patient is aware.

## 2018-08-27 NOTE — Telephone Encounter (Signed)
REVIEWED-NO ADDITIONAL RECOMMENDATIONS. 

## 2018-08-30 ENCOUNTER — Encounter (HOSPITAL_COMMUNITY): Payer: Self-pay

## 2018-08-30 ENCOUNTER — Telehealth: Payer: Self-pay | Admitting: Gastroenterology

## 2018-08-30 ENCOUNTER — Encounter (HOSPITAL_COMMUNITY)
Admission: RE | Admit: 2018-08-30 | Discharge: 2018-08-30 | Disposition: A | Discharge status: Home / Self Care | Payer: 59 | Source: Ambulatory Visit | Attending: Gastroenterology | Admitting: Gastroenterology

## 2018-08-30 DIAGNOSIS — R112 Nausea with vomiting, unspecified: Secondary | ICD-10-CM | POA: Insufficient documentation

## 2018-08-30 MED ORDER — TECHNETIUM TC 99M SULFUR COLLOID
2.0000 | Freq: Once | INTRAVENOUS | Status: AC | PRN
  Administered 2018-08-30: 1.87 via ORAL

## 2018-08-30 NOTE — Telephone Encounter (Addendum)
PLEASE CALL PT. HER GES IS NORMAL. HER NAUSEA/VOMITING IS MOST LIKELY DUE TO UNCONTROLLED REFLUX. WE HAVE EXHAUSTED ALL OPTIONS TO WORKUP HER SYMPTOMS AT [facility]. SHE SHOULD SEE THE GI DOCS AT [facility], OR [facility] FOR A SECOND OPINION(Dx: NAUSEA/VOMITING).   SHE SHOULD LET us KNOW WHERE SHE WOULD LIKE THE REFERRAL SENT.

## 2018-08-31 NOTE — Telephone Encounter (Signed)
PT is aware, does not want a referral at this time. Financially very overwhelming. She will call if she decides to do something else.

## 2018-09-03 ENCOUNTER — Encounter: Payer: Self-pay | Admitting: "Endocrinology

## 2018-09-03 ENCOUNTER — Ambulatory Visit (INDEPENDENT_AMBULATORY_CARE_PROVIDER_SITE_OTHER): Payer: Self-pay | Admitting: "Endocrinology

## 2018-09-03 VITALS — BP 125/68 | HR 72 | Ht 62.0 in | Wt 196.0 lb

## 2018-09-03 DIAGNOSIS — E270 Other adrenocortical overactivity: Secondary | ICD-10-CM

## 2018-09-03 NOTE — Progress Notes (Signed)
Endocrinology Consult Note                                            09/03/2018, 3:53 PM   Subjective:    Patient ID: Katie Blankenship, female    DOB: [birth year], PCP Katie Blankenship, Katie Blankenship   Past Medical History:  Diagnosis Date  . GERD (gastroesophageal reflux disease)   . GERD with esophagitis    Past Surgical History:  Procedure Laterality Date  . BIOPSY  07/31/2018   Procedure: BIOPSY;  Surgeon: West BaliFields, Sandi L, Blankenship;  Location: [facility];  Service: Endoscopy;;  duodenal and gastric  . CHOLECYSTECTOMY    . ESOPHAGOGASTRODUODENOSCOPY (EGD) WITH PROPOFOL N/A 07/31/2018   Procedure: ESOPHAGOGASTRODUODENOSCOPY (EGD) WITH PROPOFOL;  Surgeon: West BaliFields, Sandi L, Blankenship;  Location: [facility];  Service: Endoscopy;  Laterality: N/A;  8:45am   Social History   Socioeconomic History  . Marital status: Divorced    Spouse name: Not on file  . Number of children: Not on file  . Years of education: Not on file  . Highest education level: Not on file  Occupational History  . Not on file  Social Needs  . Financial resource strain: Not on file  . Food insecurity:    Worry: Not on file    Inability: Not on file  . Transportation needs:    Medical: Not on file    Non-medical: Not on file  Tobacco Use  . Smoking status: Never Smoker  . Smokeless tobacco: Never Used  Substance and Sexual Activity  . Alcohol use: Yes    Comment: rare  . Drug use: Yes    Types: Marijuana    Comment: occ  . Sexual activity: Not on file  Lifestyle  . Physical activity:    Days per week: Not on file    Minutes per session: Not on file  . Stress: Not on file  Relationships  . Social connections:    Talks on phone: Not on file    Gets together: Not on file    Attends religious service: Not on file    Active member of club or organization: Not on file    Attends meetings of clubs or organizations: Not on file    Relationship status: Not on file  Other Topics Concern  . Not on file   Social History Narrative   WORKS FOR [employer]. HOBBIES: PAINTING, READING , SWIMMING. 2 KIDS: 2 DAUGHTERS(AGE 40, 17)   Outpatient Encounter Medications as of 09/03/2018  Medication Sig  . LARISSIA 0.1-20 MG-MCG tablet Take 1 tablet by mouth daily.  . pantoprazole (PROTONIX) 40 MG tablet 1 PO 30 MINUTES PRIOR TO MEALS BID FOR 3 MOS THEN QD (Patient taking differently: Take 40 mg by mouth daily as needed. )  . promethazine (PHENERGAN) 12.5 MG tablet 1-2 po q4-6 h prn nausea or vomiting (Patient taking differently: Take 12.5-25 mg by mouth daily as needed for nausea or vomiting. 1-2 po q4-6 h prn nausea or vomiting)  . [DISCONTINUED] Potassium Chloride ER 20 MEQ TBCR 2 PO BID FOR 3 DAYS  . [DISCONTINUED] promethazine (PHENERGAN) 25 MG suppository Place 1 suppository (25 mg total) rectally every 6 (six) hours as needed for nausea or vomiting. (Patient not taking: Reported on 07/30/2018)  . [DISCONTINUED] promethazine (PHENERGAN) 25 MG suppository 1/2-1 PR Q4-6H  PRN NAUSEA/VOMITING (Patient taking differently: Place 12.5-25 mg rectally every 4 (four) hours as needed for nausea or vomiting. )  . [DISCONTINUED] SUMAtriptan (IMITREX) 100 MG tablet Take 100 mg by mouth as needed for migraine.    No facility-administered encounter medications on file as of 09/03/2018.    ALLERGIES: Allergies  Allergen Reactions  . Reglan [Metoclopramide] Other (See Comments)    Muscle spasms     VACCINATION STATUS:  There is no immunization history on file for this patient.  HPI    Katie Blankenship is 38 y.o. female who presents today with a medical history as above. she is being seen in consultation for hypercortisolemia requested by Katie Chimera, Blankenship.  She denies any prior history of adrenal dysfunction, no prior history of Cushing's syndrome.  She denies any prior exposure to high-dose steroids.  She was dealing with acute illness involving vomiting/dehydration in October, November, and December  which is since resolved.  She was found to have a.m. cortisol of 36.5 on July 30, 2018 at 8:47 AM. She has history of body weight as high as 240 pounds a year ago, progressively lost to her current weight of 196 pounds unintentionally.  She denies family history of adrenal, pituitary dysfunction. -She is a mother of 2 grown children.  She denies easy bruising, abnormal body fat distribution, nor abnormal muscle weakness.  She has no history of diabetes, hypertension.   Review of Systems  Constitutional: + weight loss, no fatigue, no subjective hyperthermia, no subjective hypothermia Eyes: no blurry vision, no xerophthalmia ENT: no sore throat, no nodules palpated in throat, no dysphagia/odynophagia, no hoarseness Cardiovascular: no Chest Pain, no Shortness of Breath, no palpitations, no leg swelling Respiratory: no cough, no SOB Gastrointestinal: no Nausea/Vomiting/Diarhhea Musculoskeletal: no muscle/joint aches Skin: no rashes Neurological: no tremors, no numbness, no tingling, no dizziness Psychiatric: no depression, no anxiety  Objective:    BP 125/68   Pulse 72   Ht 5\' 2"  (1.575 m)   Wt 196 lb (88.9 kg)   BMI 35.85 kg/m   Wt Readings from Last 3 Encounters:  09/03/18 196 lb (88.9 kg)  07/31/18 187 lb (84.8 kg)  07/30/18 189 lb (85.7 kg)    Physical Exam  Constitutional:  + Obese for height, not in acute distress, normal state of mind Eyes: PERRLA, EOMI, no exophthalmos ENT: moist mucous membranes, no thyromegaly, no cervical lymphadenopathy Cardiovascular: normal precordial activity, Regular Rate and Rhythm, no Murmur/Rubs/Gallops Respiratory:  adequate breathing efforts, no gross chest deformity, Clear to auscultation bilaterally Gastrointestinal: abdomen soft, Non -tender, No distension, Bowel Sounds present Musculoskeletal: no gross deformities, strength intact in all four extremities Skin: moist, warm, no rashes, - negative abnormal striae Neurological: no tremor  with outstretched hands, Deep tendon reflexes normal in all four extremities.  CMP ( most recent) CMP     Component Value Date/Time   NA 133 (L) 07/30/2018 1522   K 3.1 (L) 07/30/2018 1522   CL 98 07/30/2018 1522   CO2 25 07/30/2018 1522   GLUCOSE 115 (H) 07/30/2018 1522   BUN <5 (L) 07/30/2018 1522   CREATININE 0.75 07/30/2018 1522   CALCIUM 9.1 07/30/2018 1522   PROT 8.4 (H) 07/19/2018 1437   ALBUMIN 4.2 07/19/2018 1437   AST 78 (H) 07/19/2018 1437   ALT 128 (H) 07/19/2018 1437   ALKPHOS 54 07/19/2018 1437   BILITOT 0.6 07/19/2018 1437   GFRNONAA >60 07/30/2018 1522   GFRAA >60 07/30/2018 1522     Diabetic  Labs (most recent): Lab Results  Component Value Date   HGBA1C 5.5 07/27/2018    Results for Katie Blankenship, Bridgid Y (MRN [id number]) as of 09/03/2018 15:12  Ref. Range 07/27/2018 08:47  Cortisol, Plasma Latest Units: mcg/dL 78.236.5 (H)     Assessment & Plan:   1. Hypercortisolemia (HCC)  - Katie Blankenship  is being seen at a kind request of Katie Blankenship, Katie Blankenship. - I have reviewed her available endocrine records and clinically evaluated the patient. - Based on reviews, she has hypercortisolemia on July 30, 2018 8:47 AM at 36.5 micrograms per DL. However,  there is not sufficient information to proceed with definitive treatment plan.  The clinical weight loss, although it is unintentional, is against a diagnosis of Cushing's syndrome. -I discussed with her the need for repeated, various endocrine tests to rule out Cushing's syndrome. -She does not report any significant exposure to high-dose steroids.  However, it is possible that she did have a course of pseudo-Cushing's due to severe distress from GI symptoms between October and December.  -I approach her for 24-hour urine free cortisol and creatinine measurement and she will return in 10 days with results. -If this test is indicative, she will need more confirmatory biochemical tests and eventually imaging studies. -I  did not initiate any new prescriptions for her today.  - I advised her  to maintain close follow up with Katie Blankenship, Katie Blankenship for primary care needs.   - Time spent with the patient: 35 minutes, of which >50% was spent in obtaining information about her symptoms, reviewing her previous labs, evaluations, and treatments, counseling her about her hypercortisolemia, and developing a plan to confirm the diagnosis and long term treatment as necessary.  Katie Blankenship participated in the discussions, expressed understanding, and voiced agreement with the above plans.  All questions were answered to her satisfaction. she is encouraged to contact clinic should she have any questions or concerns prior to her return visit.  Follow up plan: Return in about 10 days (around 09/13/2018) for 24 Hour Urine Free Cortisol and Creatinine.   Marquis LunchGebre Rainn Bullinger, [facility] [address] [place], [place] [address] Phone: [phone]  Fax: [phone]     09/03/2018, 3:53 PM  This note was partially dictated with voice recognition software. Similar sounding words can be transcribed inadequately or may not  be corrected upon review.

## 2018-09-17 LAB — CORTISOL, URINE, 24 HOUR
24 Hour urine volume (VMAHVA): 1200 mL
Cortisol (Ur), Free: 47.7 mcg/24 h (ref 4.0–50.0)
RESULTS RECEIVED: 1.09 g/(24.h) (ref 0.50–2.15)

## 2018-10-03 ENCOUNTER — Telehealth: Payer: Self-pay | Admitting: Gastroenterology

## 2018-10-03 MED ORDER — PROMETHAZINE HCL 12.5 MG RE SUPP: 12.5000 mg | Freq: Four times a day (QID) | RECTAL | 0 refills | Status: DC | PRN

## 2018-10-03 NOTE — Addendum Note (Signed)
Addended by: Tiffany KocherLEWIS, Trenda Corliss S on: 10/03/2018 12:16 PM   Modules accepted: Orders

## 2018-10-03 NOTE — Telephone Encounter (Signed)
LMOM for a return call.  

## 2018-10-03 NOTE — Telephone Encounter (Signed)
I called pt and she said that she has been vomiting for the last 3 days. She is trying to keep fluids down, but not doing too well. I told her I felt she should go to ED for evaluation it sounds like she needs fluids.  She is requesting some phenergan suppositories. I told her that Dr. Darrick Penna has recommended she see another GI, has she decided who she wants to see. She said she has not thought that much about it because for the last couple of months she has done better. The only pain she complains of is in her lower esophagus from the vomiting and bile and her mouth is sore.  She said she has not done marijuana since December.  She is aware Dr. Darrick Penna is not in and won't be for several days so I am forwarding to Tana Coast, PA for recommendations.

## 2018-10-03 NOTE — Telephone Encounter (Signed)
Pt said she has been vomiting for 3 days and wanted SF to call in a prescription of Phenergan Supp to [facility] in [place]. [phone]

## 2018-10-03 NOTE — Telephone Encounter (Signed)
rx sent to pharmacy.   Agree with advice to go to ER if decreased urine output (sign of dehydration).  She needs to proceed with second opinion with GI as per [facility] recommendations.

## 2018-10-04 NOTE — Telephone Encounter (Signed)
LMOM to call.

## 2018-10-10 ENCOUNTER — Telehealth: Payer: Self-pay | Admitting: Gastroenterology

## 2018-10-10 DIAGNOSIS — R112 Nausea with vomiting, unspecified: Secondary | ICD-10-CM

## 2018-10-10 NOTE — Telephone Encounter (Signed)
Noted  

## 2018-10-10 NOTE — Telephone Encounter (Signed)
PLEASE CALL PT. I EXPLAINED 6 WEEKS AGO THAT WE HAVE NOT FOUND A REASON FOR HER NAUSEA/VOMITING. THERE IS NOTHING TO EXPLAIN WHY SHE HAS NAUSEA/VOMITING EPISODICALLY. I RECOMMENDED 6 WEEKS AGO THAT SHE SEE A GI PROVIDER AT A TERTIARY CARE CENTER BECAUSE THE ETIOLOGY OF HER SYMPTOMS ARE UNCLEAR. HOWEVER, SHE DID NOT FOLLOW MY RECOMMENDATION. THEREFORE I CANNOT GRANT HER REQUEST FOR AN EXTENSION ON HER FMLA PAPERWORK.

## 2018-10-10 NOTE — Telephone Encounter (Signed)
SEE TC FEB 19.

## 2018-10-10 NOTE — Telephone Encounter (Signed)
LMOM to call.

## 2018-10-10 NOTE — Telephone Encounter (Signed)
Pt called today and said she did go to the ED in [place] on 10/03/2018. She said she is still having the N/V. Her fmla is only good for being out 3 days every 2 weeks, with one of the days being an appt and the other 2 out for sickness. She said she is getting ready to lose her job due to not having enough days to get better.Said everytime she has one of these episodes she is sick 12-14 days. I asked her where she wanted to be referred to per Dr. Evelina Dun recommendation and she said she just could not think at this time she is so sick and so worried about her job and money.  Dr. Darrick Penna please advise!

## 2018-10-11 NOTE — Telephone Encounter (Signed)
LMOM to call.

## 2018-10-11 NOTE — Telephone Encounter (Signed)
Called [facility] for referral. Appt scheduled for 12/14/18 at 11:00am with Dr. Darnelle Spangle. Pt will receive a new pt packet in the mail.   Tried to call pt, no answer, LMOVM with appt. Also gave her phone number for [facility].

## 2018-10-11 NOTE — Addendum Note (Signed)
Addended by: Corrie Mckusick on: 10/11/2018 01:50 PM   Modules accepted: Orders

## 2018-10-11 NOTE — Telephone Encounter (Signed)
PT is aware and she said she just does not have any monty, she has $1.79 to last 2 weeks with 2 kids. She said to refer her to [facility] and she will see how she can work it out.  Forwarding to [facility] to make referral.

## 2018-10-16 NOTE — Telephone Encounter (Signed)
LMOM to call.

## 2018-10-16 NOTE — Telephone Encounter (Signed)
PT called and asked if Dr. Darrick Penna could update her FMLA paperwork. I read the info to her again from last week's note. She started crying and saying what can I do. She was not aware of the appt at [facility] and I gave her that information and told her she will receive a packet in the mail. She said she is at day 15 now being out of work and she is not out because she wants to be out, she just keeps having this problem. I explained to her that she declined when Dr. Darrick Penna first wanted to refer her. She said she just did not have the money. She said what will she do until she can be seen at [facility].  Dr. Darrick Penna, please advise!

## 2018-10-16 NOTE — Telephone Encounter (Signed)
PLEASE CALL PT. WE HAVE NOT FOUND A REASON FOR HER NAUSEA/VOMITING. THERE IS NOTHING TO EXPLAIN WHY SHE HAS NAUSEA/VOMITING EPISODICALLY EXCEPT REFLUX.  IF SHE FEELS LIKE SHE NEEDS TO BE SEEN SHE SHOULD GO TO THE NEAREST ED FOR AN EVALUATION IF SHE HAS HAD NAUSEA AND VOMITING FOR 15 DAYS.  UNFORTUNATELY BECAUSE SHE DID NOT FOLLOW MY RECOMMENDATION, I CANNOT GRANT HER REQUEST FOR AN EXTENSION ON HER FMLA PAPERWORK. IF SHE FEELS LIKE SHE NEEDS TO BE SEEN SHE SHOULD GO TO THE NEAREST ED FOR AN EVALUATION IF SHE HAS HAD NAUSEA AND VOMITING FOR 15 DAYS.

## 2018-10-17 NOTE — Telephone Encounter (Signed)
LMOM to call.

## 2018-11-21 ENCOUNTER — Ambulatory Visit: Payer: 59 | Admitting: Gastroenterology

## 2019-01-10 IMAGING — NM NM GASTRIC EMPTYING
8 series · 8 of 8 positions shown · non-contrast
Comparison: None.

CLINICAL DATA: Nausea and vomiting

EXAM:
NUCLEAR MEDICINE GASTRIC EMPTYING SCAN
TECHNIQUE: After oral ingestion of radiolabeled meal, sequential abdominal
images were obtained for 3 hours. Percentage of activity emptying
the stomach was calculated at 1 hour, 2 hours, and 3 hours.
RADIOPHARMACEUTICALS:  1.87 mCi Xc-99m sulfur colloid in
standardized meal including egg

[Series 1: 120 min · 4.14mm/px · 1 of 1 slices shown (1 of 4)]
[im 1/1]
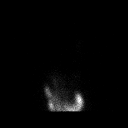

[Series 1: 0 min · 4.14mm/px · 1 of 1 slices shown (1 of 2)]
[im 1/1]
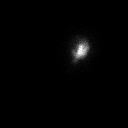

[Series 1: 0 min · 4.14mm/px · 1 of 1 slices shown (2 of 2)]
[im 1/1]
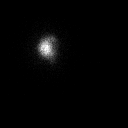

[Series 1: 120 min · 4.14mm/px · 1 of 1 slices shown (2 of 4)]
[im 1/1]
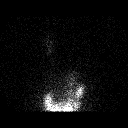

[Series 2: 60 min · 4.14mm/px · 1 of 1 slices shown (1 of 2)]
[im 1/1]
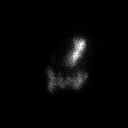

[Series 2: 60 min · 4.14mm/px · 1 of 1 slices shown (2 of 2)]
[im 1/1]
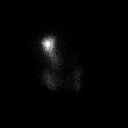

[Series 3: 120 min · 4.14mm/px · 1 of 1 slices shown (3 of 4)]
[im 1/1]
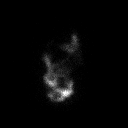

[Series 3: 120 min · 4.14mm/px · 1 of 1 slices shown (4 of 4)]
[im 1/1]
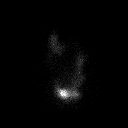

[8 of 8 positions shown; findings below may reference images not displayed]

FINDINGS: Expected location of the stomach in the left upper quadrant.
Ingested meal empties the stomach gradually over the course of the
study.

32.7% emptied at 1 hr ( normal >= 10%)

78.2% emptied at 2 hr ( normal >= 40%)

95.6% emptied at 3 hr ( normal >= 70%)

Normal greater than 90% emptying after 4 hours post radiotracer
administration.
IMPRESSION: Normal gastric emptying study.

## 2020-01-01 ENCOUNTER — Ambulatory Visit (INDEPENDENT_AMBULATORY_CARE_PROVIDER_SITE_OTHER): Payer: 59 | Admitting: Allergy & Immunology

## 2020-01-01 ENCOUNTER — Encounter: Payer: Self-pay | Admitting: Allergy & Immunology

## 2020-01-01 ENCOUNTER — Other Ambulatory Visit: Payer: Self-pay

## 2020-01-01 VITALS — BP 118/76 | HR 77 | Temp 98.7°F | Resp 16 | Ht 62.4 in | Wt 192.9 lb

## 2020-01-01 DIAGNOSIS — R112 Nausea with vomiting, unspecified: Secondary | ICD-10-CM

## 2020-01-01 DIAGNOSIS — J3089 Other allergic rhinitis: Secondary | ICD-10-CM | POA: Diagnosis not present

## 2020-01-01 MED ORDER — AZELASTINE HCL 0.1 % NA SOLN: 2.0000 | Freq: Two times a day (BID) | NASAL | 5 refills | Status: DC

## 2020-01-01 MED ORDER — CARBINOXAMINE MALEATE 6 MG PO TABS: ORAL_TABLET | ORAL | 5 refills | Status: DC

## 2020-01-01 NOTE — Patient Instructions (Addendum)
1. Intractable vomiting with nausea - Testing was negative to the entire food panel. - There is a the low positive predictive value of food allergy testing and hence the high possibility of false positives. - In contrast, food allergy testing has a high negative predictive value, therefore if testing is negative we can be relatively assured that they are indeed negative.  - This does not rule out intolerances, but it does rule out the need for an epinephrine auto-injector.   2. Chronic rhinitis - Testing today showed: dust mites - Copy of test results provided.  - Avoidance measures provided. - We are going to attack the postnasal drip aggressively to see if thishelps.  - Stop taking: your current antihistamines - Continue with: Flonase (fluticasone) but INCREASE TO TWO SPRAYS per nostril TWICE daily - Start taking: Ryvent (carbinoxamine) 6mg  tablet 3-4 times daily as needed and Astelin (azelastine) 2 sprays per nostril 1-2 times daily as needed - You can use an extra dose of the antihistamine, if needed, for breakthrough symptoms.  - Consider nasal saline rinses 1-2 times daily to remove allergens from the nasal cavities as well as help with mucous clearance (this is especially helpful to do before the nasal sprays are given) - Consider allergy shots as a means of long-term control. - Allergy shots "re-train" and "reset" the immune system to ignore environmental allergens and decrease the resulting immune response to those allergens (sneezing, itchy watery eyes, runny nose, nasal congestion, etc).    - Allergy shots improve symptoms in 75-85% of patients.  - We can discuss more at the next appointment if the medications are not working for you.   3. Return in about 4 weeks (around 01/29/2020). This can be an in-person, a virtual Webex or a telephone follow up visit.   Please inform us of any Emergency Department visits, hospitalizations, or changes in symptoms. Call us before going to the ED  for breathing or allergy symptoms since we might be able to fit you in for a sick visit. Feel free to contact us anytime with any questions, problems, or concerns.  It was a pleasure to meet you today!  Websites that have reliable patient information: 1. American Academy of Asthma, Allergy, and Immunology: www.aaaai.org 2. Food Allergy Research and Education (FARE): foodallergy.org 3. Mothers of Asthmatics: http://www.asthmacommunitynetwork.org 4. American College of Allergy, Asthma, and Immunology: www.acaai.org   COVID-19 Vaccine Information can be found at: ShippingScam.co.uk For questions related to vaccine distribution or appointments, please email vaccine@[email or web address] or call 2604284123.     "Like" [place] on Facebook and Instagram for our latest updates!       HAPPY SPRING!  Make sure you are registered to vote! If you have moved or changed any of your contact information, you will need to get this updated before voting!  In some cases, you MAY be able to register to vote online: [email or web address]    Control of Dust Mite Allergen    Dust mites play a major role in allergic asthma and rhinitis.  They occur in environments with high humidity wherever human skin is found.  Dust mites absorb humidity from the atmosphere (ie, they do not drink) and feed on organic matter (including shed human and animal skin).  Dust mites are a microscopic type of insect that you cannot see with the naked eye.  High levels of dust mites have been detected from mattresses, pillows, carpets, upholstered furniture, bed covers, clothes, soft toys and any woven material.  The principal allergen of  the dust mite is found in its feces.  A gram of dust may contain 1,000 mites and 250,000 fecal particles.  Mite antigen is easily measured in the air during house cleaning activities.  Dust mites do not bite and do not  cause harm to humans, other than by triggering allergies/asthma.    Ways to decrease your exposure to dust mites in your home:  1. Encase mattresses, box springs and pillows with a mite-impermeable barrier or cover   2. Wash sheets, blankets and drapes weekly in hot water (130 F) with detergent and dry them in a dryer on the hot setting.  3. Have the room cleaned frequently with a vacuum cleaner and a damp dust-mop.  For carpeting or rugs, vacuuming with a vacuum cleaner equipped with a high-efficiency particulate air (HEPA) filter.  The dust mite allergic individual should not be in a room which is being cleaned and should wait 1 hour after cleaning before going into the room. 4. Do not sleep on upholstered furniture (eg, couches).   5. If possible removing carpeting, upholstered furniture and drapery from the home is ideal.  Horizontal blinds should be eliminated in the rooms where the person spends the most time (bedroom, study, television room).  Washable vinyl, roller-type shades are optimal. 6. Remove all non-washable stuffed toys from the bedroom.  Wash stuffed toys weekly like sheets and blankets above.   7. Reduce indoor humidity to less than 50%.  Inexpensive humidity monitors can be purchased at most hardware stores.  Do not use a humidifier as can make the problem worse and are not recommended.

## 2020-01-01 NOTE — Progress Notes (Signed)
NEW PATIENT  Date of Service/Encounter:  01/01/20  Referring provider: Richardean Chimeraaniel, Terry G, MD   Assessment:   Intractable vomiting with nausea - with normal endoscopy  Perennial allergic rhinitis (dust mites)   All of the food testing was negative today. This has an excellent negative predictive value, therefore I reassured her that these were very likely negative. Her symptoms were not reproducible with the same food and not consistent with IgE symptoms anyway, so I do not think that blood work is indicated at this time. She does tell me that the "mixing of the mucous and the acid" make her symptoms particularly terrible, therefore we are going to work on addressing the postnasal drip and mucous production to see if this provides any relief at all.   Plan/Recommendations:   1. Intractable vomiting with nausea - Testing was negative to the entire food panel. - There is a the low positive predictive value of food allergy testing and hence the high possibility of false positives. - In contrast, food allergy testing has a high negative predictive value, therefore if testing is negative we can be relatively assured that they are indeed negative.  - This does not rule out intolerances, but it does rule out the need for an epinephrine auto-injector.   2. Chronic rhinitis - Testing today showed: dust mites - Copy of test results provided.  - Avoidance measures provided. - We are going to attack the postnasal drip aggressively to see if thishelps.  - Stop taking: your current antihistamines - Continue with: Flonase (fluticasone) but INCREASE TO TWO SPRAYS per nostril TWICE daily - Start taking: Ryvent (carbinoxamine) 6mg  tablet 3-4 times daily as needed and Astelin (azelastine) 2 sprays per nostril 1-2 times daily as needed - You can use an extra dose of the antihistamine, if needed, for breakthrough symptoms.  - Consider nasal saline rinses 1-2 times daily to remove allergens from the  nasal cavities as well as help with mucous clearance (this is especially helpful to do before the nasal sprays are given) - Consider allergy shots as a means of long-term control. - Allergy shots "re-train" and "reset" the immune system to ignore environmental allergens and decrease the resulting immune response to those allergens (sneezing, itchy watery eyes, runny nose, nasal congestion, etc).    - Allergy shots improve symptoms in 75-85% of patients.  - We can discuss more at the next appointment if the medications are not working for you.   3. Return in about 4 weeks (around 01/29/2020). This can be an in-person, a virtual Webex or a telephone follow up visit.  Subjective:   Katie Blankenship is a 39 y.o. female presenting today for evaluation of  Chief Complaint  Patient presents with  . Allergic Rhinitis     Sinus congestion, coughing and vomiting    Katie Blankenship has a history of the following: Patient Active Problem List   Diagnosis Date Noted  . Perennial allergic rhinitis 01/01/2020  . Hypercortisolemia 09/03/2018  . Dyspepsia   . Nausea with vomiting 07/26/2018  . Transaminitis 07/26/2018    History obtained from: chart review and patient.  Katie CrawfordJessica Y Winterbottom was referred by Richardean Chimeraaniel, Terry G, MD.     Katie Blankenship is a 39 y.o. female presenting for an evaluation of recurrent emesis and concern for food allergies.   She has been vomiting for a while now. She has issues in the morning with sinus drainage that is so thick that she cannot swallow. She reports that  she has acid in her throat that "bubbles up" into her throat. She has seen GI in the past. She had a scoping performed that was unremarkable. She also had a digestive test and she had a GI motility test to see where things were going and how fast they were going. Everything was normal. Endoscopy showed a small hiatal hernia and mild gastritis, but was otherwise negative. Per a telephone note from January 2020, Dr. Darrick Penna felt  that her symptoms were from uncontrolled reflux and they had exhausted all of the local options; she recommended that [place] look for a second opinion at an academic center.   She had an Endocrine workup that was normal evidently. This was done due to elevated cortisol. She underwent urine studies that were normal.   She has lost upwards of 50+ pounds during this entire time. She has had these recur six different times with up to 21 days of persistent vomiting. The only thing that really helped was Phenergan. She can go upwards of 2-3 weeks without any vomiting. Two weeks is the average for her.   Her main concern for food allergies. She does not notice any particular food that makes this worse. She knows that anything spice related makes it worse. Tomato and pasta seems to make it worse as well as milk which results in mucous production.   She tends to have issues during the spring/fall with weather changes. She does not take anything routinely, but she will using cetirizine more on a PRN basis. She does not use any nose sprays at all, although she has a prescription for   Currently she works for [employer] with [employer]   Otherwise, there is no history of other atopic diseases, including asthma, drug allergies, environmental allergies, stinging insect allergies, eczema, urticaria or contact dermatitis. There is no significant infectious history. Vaccinations are up to date.    Past Medical History: Patient Active Problem List   Diagnosis Date Noted  . Perennial allergic rhinitis 01/01/2020  . Hypercortisolemia 09/03/2018  . Dyspepsia   . Nausea with vomiting 07/26/2018  . Transaminitis 07/26/2018    Medication List:  Allergies as of 01/01/2020      Reactions   Reglan [metoclopramide] Other (See Comments)   Muscle spasms      Medication List       Accurate as of Jan 01, 2020 11:36 PM. If you have any questions, ask your nurse or doctor.        azelastine 0.1 %  nasal spray Commonly known as: ASTELIN Place 2 sprays into both nostrils 2 (two) times daily. Started by: Alfonse Spruce, MD   Carbinoxamine Maleate 6 MG Tabs Commonly known as: RyVent 6mg  tablet 3-4 times daily as needed Started by: , MD   Alfonse Spruce 0.1-20 MG-MCG tablet Generic drug: levonorgestrel-ethinyl estradiol Take 1 tablet by mouth daily.   pantoprazole 40 MG tablet Commonly known as: Protonix 1 PO 30 MINUTES PRIOR TO MEALS BID FOR 3 MOS THEN QD What changed:   how much to take  how to take this  when to take this  reasons to take this  additional instructions   promethazine 12.5 MG tablet Commonly known as: PHENERGAN 1-2 po q4-6 h prn nausea or vomiting What changed:   how much to take  how to take this  when to take this  reasons to take this   promethazine 12.5 MG suppository Commonly known as: Phenergan Place 1 suppository (12.5 mg total) rectally  every 6 (six) hours as needed for nausea or vomiting. What changed: Another medication with the same name was changed. Make sure you understand how and when to take each.       Birth History: non-contributory  Developmental History: non-contributory  Past Surgical History: Past Surgical History:  Procedure Laterality Date  . BIOPSY  07/31/2018   Procedure: BIOPSY;  Surgeon: West Bali, MD;  [facility];  Service: Endoscopy;;  duodenal and gastric  . CHOLECYSTECTOMY    . ESOPHAGOGASTRODUODENOSCOPY (EGD) WITH PROPOFOL N/A 07/31/2018   Procedure: ESOPHAGOGASTRODUODENOSCOPY (EGD) WITH PROPOFOL;  Surgeon: West Bali, MD;  [facility]: [facility];  Service: Endoscopy;  Laterality: N/A;  8:45am     Family History: Family History  Problem Relation Age of Onset  . Asthma Mother   . Eczema Father   . Asthma Father   . Lung cancer Maternal Grandfather   . Urticaria Maternal Grandfather   . Colon cancer Neg Hx   . Colon polyps Neg Hx      Social  History: [place] lives at home with her family. She lives in a house that is 40 years old. There is carpeting in the main living areas and carpeting in the bedrooms. There is gas heating and fans for cooling. There are frogs, fish, dog, and cat. There is a dog and cat outside of the home. There are dust mite coverings on the bedding. There is no tobacco exposure in the home. 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    Review of Systems  Constitutional: Negative for chills, fever, malaise/fatigue and weight loss.  HENT: Positive for congestion and sinus pain. Negative for ear discharge and ear pain.   Eyes: Negative for pain, discharge and redness.  Respiratory: Negative for cough, sputum production, shortness of breath and wheezing.   Cardiovascular: Negative.  Negative for chest pain and palpitations.  Gastrointestinal: Positive for abdominal pain, diarrhea, nausea and vomiting. Negative for constipation and heartburn.  Skin: Negative.  Negative for itching and rash.  Neurological: Negative for dizziness and headaches.  Endo/Heme/Allergies: Negative for environmental allergies. Does not bruise/bleed easily.       Objective:   Blood pressure 118/76, pulse 77, temperature 98.7 F (37.1 C), temperature source Temporal, resp. rate 16, height 5' 2.4" (1.585 m), weight 192 lb 14.4 oz (87.5 kg), SpO2 97 %. Body mass index is 34.83 kg/m.   Physical Exam:   Physical Exam  Constitutional: She appears well-developed.  HENT:  Head: Normocephalic and atraumatic.  Right Ear: Tympanic membrane, external ear and ear canal normal. No drainage, swelling or tenderness. Tympanic membrane is not injected, not scarred, not erythematous, not retracted and not bulging.  Left Ear: Tympanic membrane, external ear and ear canal normal. No drainage, swelling or tenderness. Tympanic membrane is not injected, not scarred, not erythematous, not retracted and not bulging.  Nose: Mucosal  edema and rhinorrhea present. No nasal deformity or septal deviation. No epistaxis. Right sinus exhibits no maxillary sinus tenderness and no frontal sinus tenderness. Left sinus exhibits no maxillary sinus tenderness and no frontal sinus tenderness.  Mouth/Throat: Uvula is midline and oropharynx is clear and moist. Mucous membranes are not pale and not dry.  There is some cobblestoning present in the posterior oropharynx. Tonsils are normally sized bilaterally.  Eyes: Pupils are equal, round, and reactive to light. Conjunctivae and EOM are normal. Right eye exhibits no chemosis and no discharge. Left eye exhibits no chemosis and no  discharge. Right conjunctiva is not injected. Left conjunctiva is not injected.  Cardiovascular: Normal rate, regular rhythm and normal heart sounds.  Respiratory: Effort normal and breath sounds normal. No accessory muscle usage. No tachypnea. No respiratory distress. She has no wheezes. She has no rhonchi. She has no rales. She exhibits no tenderness.  GI: There is no abdominal tenderness. There is no rebound and no guarding.  Lymphadenopathy:       Head (right side): No submandibular, no tonsillar and no occipital adenopathy present.       Head (left side): No submandibular, no tonsillar and no occipital adenopathy present.    She has no cervical adenopathy.  Neurological: She is alert.  Skin: No abrasion, no petechiae and no rash noted. Rash is not papular, not vesicular and not urticarial. No erythema. No pallor.  Psychiatric: She has a normal mood and affect.     Diagnostic studies:    Allergy Studies:    Airborne Adult Perc - 01/01/20 1450    Time Antigen Placed  0250    Allergen Manufacturer  Waynette Buttery    Location  Back    Number of Test  59    Panel 1  Select    1. Control-Buffer 50% Glycerol  Negative    2. Control-Histamine 1 mg/ml  2+    3. Albumin saline  Negative    4. Bahia  Negative    5. [place] Territ[place]  Negative    6. Johnson  Negative    7. [place]  Blue  Negative    8. Meadow Fescue  Negative    9. Perennial Rye  Negative    10. Sweet Vernal  Negative    11. Timothy  Negative    12. Cocklebur  Negative    13. Burweed Marshelder  Negative    14. Ragweed, short  Negative    15. Ragweed, Giant  Negative    16. Plantain,  English  Negative    17. Lamb's Quarters  Negative    18. Sheep Sorrell  Negative    19. Rough Pigweed  Negative    20. Marsh Elder, Rough  Negative    21. Mugwort, Common  Negative    22. Ash mix  Negative    23. Birch mix  Negative    24. Beech American  Negative    25. Box, Elder  Negative    26. Cedar, red  Negative    27. Cottonwood, Guinea-Bissau  Negative    28. Elm mix  Negative    29. Hickory  Negative    30. Maple mix  Negative    31. Oak, Guinea-[place]issau mix  Negative    32. Pecan Pollen  Negative    33. Pine mix  Negative    34. Sycamore Eastern  Negative    35. Walnut, Black Pollen  Negative    36. Alternaria alternata  Negative    37. Cladosporium Herbarum  Negative    38. Aspergillus mix  Negative    39. Penicillium mix  Negative    40. Bipolaris sorokiniana (Helminthosporium)  Negative    41. Drechslera spicifera (Curvularia)  Negative    42. Mucor plumbeus  Negative    43. Fusarium moniliforme  Negative    44. Aureobasidium pullulans (pullulara)  Negative    45. Rhizopus oryzae  Negative    46. Botrytis cinera  Negative    47. Epicoccum nigrum  Negative    48. Phoma betae  Negative    49. Candida  Albicans  Negative    50. Trichophyton mentagrophytes  Negative    51. Mite, D Farinae  5,000 AU/ml  2+    52. Mite, D Pteronyssinus  5,000 AU/ml  2+    53. Cat Hair 10,000 BAU/ml  Negative    54.  Dog Epithelia  Negative    55. Mixed Feathers  Negative    56. Horse Epithelia  Negative    57. Cockroach, German  Negative    58. Mouse  Negative    59. Tobacco Leaf  Negative     Intradermal - 01/01/20 1602    Time Antigen Placed  1545    Allergen Manufacturer  Lavella Hammock    Location  Arm    Number of  Test  14    Intradermal  Select    Control  Negative    Guatem[place]  Negative    Johnson  Negative    7 Grass  Negative    Ragweed mix  Negative    Weed mix  Negative    Tree mix  Negative    Mold 1  Negative    Mold 2  Negative    Mold 3  Negative    Mold 4  Negative    Cat  Negative    Dog  Negative    Cockroach  Negative     Food Adult Perc - 01/01/20 1400    Time Antigen Placed  1451    Allergen Manufacturer  Lavella Hammock    Location  Back     Control-buffer 50% Glycerol  Negative    Control-Histamine 1 mg/ml  2+    1. Peanut  Negative    2. Soybean  Negative    3. Wheat  Negative    4. Sesame  Negative    5. Milk, cow  Negative    6. Egg White, Chicken  Negative    7. Casein  Negative    8. Shellfish Mix  Negative    9. Fish Mix  Negative    10. Cashew  Negative    11. Pecan Food  Negative    12. [place]  Negative    13. Almond  Negative    14. Hazelnut  Negative    15. [place] nut  Negative    16. Coconut  Negative    17. Pistachio  Negative    18. Catfish  Negative    19. Bass  Negative    20. Trout  Negative    21. Tuna  Negative    22. Salmon  Negative    23. Flounder  Negative    24. Codfish  Negative    25. Shrimp  Negative    26. Crab  Negative    27. Lobster  Negative    28. Oyster  Negative    29. Scallops  Negative    30. Barley  Negative    31. Oat   Negative    32. Rye   Negative    33. Hops  Negative    34. Rice  Negative    35. Cottonseed  Negative    36. Saccharomyces Cerevisiae   Negative    37. Pork  Negative    38. [place] Meat  Negative    39. Chicken Meat  Negative    40. Beef  Negative    41. Lamb  Negative    42. Tomato  Negative    43. White Potato  Negative    44. Sweet Potato  Negative    45. Pea, Green/English  Negative    46. Navy Bean  Negative    47. Mushrooms  Negative    48. Avocado  Negative    49. Onion  Negative    50. Cabbage  Negative    51. Carrots  Negative    52. Celery  Negative    53. Corn  Negative     54. Cucumber  Negative    55. Grape (White seedless)  Negative    56. Orange   Negative    57. Banana  Negative    58. Apple  Negative    59. Peach  Negative    60. Strawberry  Negative    61. Cantaloupe  Negative    62. Watermelon  Negative    63. Pineapple  Negative    64. Chocolate/Cacao bean  Negative    65. Karaya Gum  Negative    66. Acacia (Arabic Gum)  Negative    67. Cinnamon  Negative    68. Nutmeg  Negative    69. Ginger  Negative    70. Garlic  Negative    71. Pepper, black  Negative    72. Mustard  Negative       Allergy testing results were read and interpreted by myself, documented by clinical staff.         Malachi Bonds, MD [facility]

## 2020-02-14 ENCOUNTER — Ambulatory Visit: Payer: 59 | Admitting: Allergy & Immunology

## 2023-04-19 ENCOUNTER — Ambulatory Visit: Payer: 59 | Admitting: Orthopedic Surgery

## 2023-04-26 ENCOUNTER — Ambulatory Visit: Payer: 59 | Admitting: Orthopedic Surgery

## 2023-11-29 ENCOUNTER — Other Ambulatory Visit (INDEPENDENT_AMBULATORY_CARE_PROVIDER_SITE_OTHER): Payer: Self-pay

## 2023-11-29 ENCOUNTER — Ambulatory Visit: Admitting: Orthopedic Surgery

## 2023-11-29 ENCOUNTER — Encounter: Payer: Self-pay | Admitting: Orthopedic Surgery

## 2023-11-29 VITALS — BP 119/87 | HR 78 | Ht 62.5 in | Wt 213.0 lb

## 2023-11-29 DIAGNOSIS — M67432 Ganglion, left wrist: Secondary | ICD-10-CM | POA: Diagnosis not present

## 2023-11-29 DIAGNOSIS — Z01818 Encounter for other preprocedural examination: Secondary | ICD-10-CM

## 2023-11-29 DIAGNOSIS — M25532 Pain in left wrist: Secondary | ICD-10-CM | POA: Diagnosis not present

## 2023-11-29 NOTE — H&P (View-Only) (Signed)
 New Patient Visit  Assessment: Katie Blankenship is a 43 y.o. female with the following: 1. Ganglion cyst of dorsum of left wrist  Plan: Katie Blankenship has a large cyst on the dorsal aspect of the left wrist.  It has been bothering her for a while.  She would like to have it excised.  Think this is reasonable.  Procedure was discussed in detail.  All questions have been answered.  Plan to proceed with surgery on 12/25/2023.  Risks and benefits of the surgery, including, but not limited to infection, bleeding, persistent pain, need for further surgery and more severe complications associated with anesthesia were discussed with the patient.  The patient has elected to proceed.    Follow-up: Return for After surgery; DOS 12/25/23.  Subjective:  Chief Complaint  Patient presents with   Wrist Pain    L  nodule for over a yr getting larger.    History of Present Illness: Katie Blankenship is a 43 y.o. female who has been referred by Donzetta Sprung, MD for evaluation of left wrist pain.  She states that she has had a growth in the left wrist for the past year.  It has been larger, and occasionally will get smaller.  It has never popped.  She has not had an aspiration.  No prior surgeries.  She had something similar in the right wrist, which has subsequently gone away.  Her daughter is to get married in September, and she wants this taken care of prior to the wedding.  No numbness or tingling distally.  Review of Systems: No fevers or chills No numbness or tingling No chest pain No shortness of breath No bowel or bladder dysfunction No GI distress No headaches   Medical History:  Past Medical History:  Diagnosis Date   Eczema    GERD (gastroesophageal reflux disease)    GERD with esophagitis     Past Surgical History:  Procedure Laterality Date   BIOPSY  07/31/2018   Procedure: BIOPSY;  Surgeon: West Bali, MD;  [facility];  Service: Endoscopy;;  duodenal and  gastric   CHOLECYSTECTOMY     ESOPHAGOGASTRODUODENOSCOPY (EGD) WITH PROPOFOL N/A 07/31/2018   Procedure: ESOPHAGOGASTRODUODENOSCOPY (EGD) WITH PROPOFOL;  Surgeon: West Bali, MD;  [facility];  Service: Endoscopy;  Laterality: N/A;  8:45am    Family History  Problem Relation Age of Onset   Asthma Mother    Eczema Father    Asthma Father    Lung cancer Maternal Grandfather    Urticaria Maternal Grandfather    Colon cancer Neg Hx    Colon polyps Neg Hx    Social History   Tobacco Use   Smoking status: Never   Smokeless tobacco: Never  Vaping Use   Vaping status: Never Used  Substance Use Topics   Alcohol use: Yes    Comment: rare   Drug use: Yes    Types: Marijuana    Comment: occ    Allergies  Allergen Reactions   Reglan [Metoclopramide] Other (See Comments)    Muscle spasms     Current Meds  Medication Sig   azelastine (ASTELIN) 0.1 % nasal spray Place 2 sprays into both nostrils 2 (two) times daily.   Carbinoxamine Maleate (RYVENT) 6 MG TABS 6mg  tablet 3-4 times daily as needed   LARISSIA 0.1-20 MG-MCG tablet Take 1 tablet by mouth daily.   pantoprazole (PROTONIX) 40 MG tablet 1 PO 30 MINUTES PRIOR TO MEALS BID  FOR 3 MOS THEN QD (Patient taking differently: Take 40 mg by mouth daily as needed.)   promethazine (PHENERGAN) 12.5 MG suppository Place 1 suppository (12.5 mg total) rectally every 6 (six) hours as needed for nausea or vomiting.   promethazine (PHENERGAN) 12.5 MG tablet 1-2 po q4-6 h prn nausea or vomiting (Patient taking differently: Take 12.5-25 mg by mouth daily as needed for nausea or vomiting. 1-2 po q4-6 h prn nausea or vomiting)    Objective: BP 119/87   Pulse 78   Ht 5' 2.5" (1.588 m)   Wt 213 lb (96.6 kg)   BMI 38.34 kg/m   Physical Exam:  General: Alert and oriented. and No acute distress. Gait: Normal gait.  Evaluation of the left hand demonstrates a noticeable growth on the dorsal aspect of the left wrist.  No  overlying skin changes.  Skin is mobile.  Firm growth, greater than 1 cm.  No tenderness to palpation.  No concern for infection.  She has good grip strength.  Sensation is intact distally.  IMAGING: I personally ordered and reviewed the following images  X-rays of the left wrist were obtained in clinic today.  There is a marker.  No acute bony changes.  No degenerative changes.  No dislocation.  No bony lesions.  Soft tissue swelling in the area of the marker.  Impression: Negative left wrist x-ray, with soft tissue swelling of the dorsum of the wrist   New Medications:  No orders of the defined types were placed in this encounter.     Oliver Barre, MD  11/29/2023 10:13 AM

## 2023-11-29 NOTE — Progress Notes (Signed)
 New Patient Visit  Assessment: Katie Blankenship is a 43 y.o. female with the following: 1. Ganglion cyst of dorsum of left wrist  Plan: LEEBA BARBE has a large cyst on the dorsal aspect of the left wrist.  It has been bothering her for a while.  She would like to have it excised.  Think this is reasonable.  Procedure was discussed in detail.  All questions have been answered.  Plan to proceed with surgery on 12/25/2023.  Risks and benefits of the surgery, including, but not limited to infection, bleeding, persistent pain, need for further surgery and more severe complications associated with anesthesia were discussed with the patient.  The patient has elected to proceed.    Follow-up: Return for After surgery; DOS 12/25/23.  Subjective:  Chief Complaint  Patient presents with   Wrist Pain    L  nodule for over a yr getting larger.    History of Present Illness: Katie Blankenship is a 43 y.o. female who has been referred by Donzetta Sprung, MD for evaluation of left wrist pain.  She states that she has had a growth in the left wrist for the past year.  It has been larger, and occasionally will get smaller.  It has never popped.  She has not had an aspiration.  No prior surgeries.  She had something similar in the right wrist, which has subsequently gone away.  Her daughter is to get married in September, and she wants this taken care of prior to the wedding.  No numbness or tingling distally.  Review of Systems: No fevers or chills No numbness or tingling No chest pain No shortness of breath No bowel or bladder dysfunction No GI distress No headaches   Medical History:  Past Medical History:  Diagnosis Date   Eczema    GERD (gastroesophageal reflux disease)    GERD with esophagitis     Past Surgical History:  Procedure Laterality Date   BIOPSY  07/31/2018   Procedure: BIOPSY;  Surgeon: West Bali, MD;  Location: [facility];  Service: Endoscopy;;  duodenal and  gastric   CHOLECYSTECTOMY     ESOPHAGOGASTRODUODENOSCOPY (EGD) WITH PROPOFOL N/A 07/31/2018   Procedure: ESOPHAGOGASTRODUODENOSCOPY (EGD) WITH PROPOFOL;  Surgeon: West Bali, MD;  Location: [facility];  Service: Endoscopy;  Laterality: N/A;  8:45am    Family History  Problem Relation Age of Onset   Asthma Mother    Eczema Father    Asthma Father    Lung cancer Maternal Grandfather    Urticaria Maternal Grandfather    Colon cancer Neg Hx    Colon polyps Neg Hx    Social History   Tobacco Use   Smoking status: Never   Smokeless tobacco: Never  Vaping Use   Vaping status: Never Used  Substance Use Topics   Alcohol use: Yes    Comment: rare   Drug use: Yes    Types: Marijuana    Comment: occ    Allergies  Allergen Reactions   Reglan [Metoclopramide] Other (See Comments)    Muscle spasms     Current Meds  Medication Sig   azelastine (ASTELIN) 0.1 % nasal spray Place 2 sprays into both nostrils 2 (two) times daily.   Carbinoxamine Maleate (RYVENT) 6 MG TABS 6mg  tablet 3-4 times daily as needed   LARISSIA 0.1-20 MG-MCG tablet Take 1 tablet by mouth daily.   pantoprazole (PROTONIX) 40 MG tablet 1 PO 30 MINUTES PRIOR TO MEALS BID  FOR 3 MOS THEN QD (Patient taking differently: Take 40 mg by mouth daily as needed.)   promethazine (PHENERGAN) 12.5 MG suppository Place 1 suppository (12.5 mg total) rectally every 6 (six) hours as needed for nausea or vomiting.   promethazine (PHENERGAN) 12.5 MG tablet 1-2 po q4-6 h prn nausea or vomiting (Patient taking differently: Take 12.5-25 mg by mouth daily as needed for nausea or vomiting. 1-2 po q4-6 h prn nausea or vomiting)    Objective: BP 119/87   Pulse 78   Ht 5' 2.5" (1.588 m)   Wt 213 lb (96.6 kg)   BMI 38.34 kg/m   Physical Exam:  General: Alert and oriented. and No acute distress. Gait: Normal gait.  Evaluation of the left hand demonstrates a noticeable growth on the dorsal aspect of the left wrist.  No  overlying skin changes.  Skin is mobile.  Firm growth, greater than 1 cm.  No tenderness to palpation.  No concern for infection.  She has good grip strength.  Sensation is intact distally.  IMAGING: I personally ordered and reviewed the following images  X-rays of the left wrist were obtained in clinic today.  There is a marker.  No acute bony changes.  No degenerative changes.  No dislocation.  No bony lesions.  Soft tissue swelling in the area of the marker.  Impression: Negative left wrist x-ray, with soft tissue swelling of the dorsum of the wrist   New Medications:  No orders of the defined types were placed in this encounter.     Oliver Barre, MD  11/29/2023 10:13 AM

## 2023-11-30 ENCOUNTER — Other Ambulatory Visit: Payer: Self-pay

## 2023-11-30 DIAGNOSIS — M67432 Ganglion, left wrist: Secondary | ICD-10-CM

## 2023-12-21 NOTE — Patient Instructions (Signed)
 Katie Blankenship  12/21/2023     @PREFPERIOPPHARMACY @   Your procedure is scheduled on 5/5.  Report to [facility] at 0600 A.M.  Call this number if you have problems the morning of surgery:  [phone]  If you experience any cold or flu symptoms such as cough, fever, chills, shortness of breath, etc. between now and your scheduled surgery, please notify us  at the above number.   Remember:  Do not eat or drink after midnight.  You may drink clear liquids until 0330 .  Clear liquids allowed are:                    Water , Juice (No red color; non-citric and without pulp; diabetics please choose diet or no sugar options), Carbonated beverages (diabetics please choose diet or no sugar options), Clear Tea (No creamer, milk, or cream, including half & half and powdered creamer), and Black Coffee Only (No creamer, milk or cream, including half & half and powdered creamer)    Take these medicines the morning of surgery with A SIP OF WATER  none    Do not wear jewelry, make-up or nail polish, including gel polish,  artificial nails, or any other type of covering on natural nails (fingers and  toes).  Do not wear lotions, powders, or perfumes, or deodorant.  Do not shave 48 hours prior to surgery.  Men may shave face and neck.  Do not bring valuables to the hospital.  [facility] is not responsible for any belongings or valuables.  Contacts, dentures or bridgework may not be worn into surgery.  Leave your suitcase in the car.  After surgery it may be brought to your room.  For patients admitted to the hospital, discharge time will be determined by your treatment team.  Patients discharged the day of surgery will not be allowed to drive home.   Name and phone number of your driver:   family Special instructions:  Drink your carb loading drink at 0330 on 5/5 (the day of surgery)   Please read over the following fact sheets that you were given. Coughing and Deep Breathing, Surgical Site  Infection Prevention, Anesthesia Post-op Instructions, and Care and Recovery After Surgery      Removing a Pocket of Fluid at a Joint (Ganglion Cyst Removal): What to Know After After having a pocket of fluid (ganglion cyst) removed from a joint, it's common to feel sore or stiff. You may also have some swelling. You may have to wear a splint or brace. Follow these instructions at home: Medicines Take your medicines only as told. Ask your health care provider if it's safe to drive or use machines while taking your medicine. If you have a brace: Wear the brace as told. Take it off only if your provider says you can. Check the skin around it every day. Tell your provider if you see problems. Loosen the brace if your fingers or toes tingle, are numb, or turn cold and blue. Keep the brace clean and dry. Bathing Do not take baths, swim, or use a hot tub until you're told it's OK. Ask if you can shower. If you have a brace that isn't waterproof: Do not let it get wet. Ask if you can take it off to bathe. If not, cover it when you take a bath or shower. Use a cover that doesn't let any water  in. Caring for your cut from surgery  Take care of your cut from surgery as  told. Make sure you: Wash your hands with soap and water  for at least 20 seconds before and after you change your bandage. If you can't use soap and water , use hand sanitizer. Change your bandage. Leave stitches or skin glue alone. Leave tape strips alone unless you're told to take them off. You may trim the edges of the tape strips if they curl up. Check the area around your cut every day for signs of infection. Check for: Redness, swelling, or pain. Fluid or blood. Warmth. Pus or a bad smell. Managing pain, stiffness, and swelling  Use ice or an ice pack as told. If you have a brace that you can take off, remove it only as told. Place a towel between your skin and the ice. Leave the ice on for 20 minutes, 2-3 times a  day. If your skin turns red, take off the ice right away to prevent skin damage. The risk of damage is higher if you can't feel pain, heat, or cold. Move your fingers or toes often to reduce stiffness and swelling. Raise your affected joint above the level of your heart while you're sitting or lying down. Use pillows as needed. Activity Avoid doing things that cause pain. Ask: When it's safe for you to drive. When you should start doing movement exercises. What things are safe for you to do at home. When you can go back to work or school. Contact a health care provider if: Your pain doesn't get better with medicine. You have stiffness or swelling that gets worse. Your brace doesn't fit right. You have any signs of infection. You have a fever. This information is not intended to replace advice given to you by your health care provider. Make sure you discuss any questions you have with your health care provider. Document Revised: 02/02/2023 Document Reviewed: 02/02/2023 Elsevier Patient Education  2024 Elsevier Inc.Pockets of Fluid at Joints (Ganglion Cysts): What to Know  Pockets of fluid near your joints are called ganglion cysts. They're not cancer. These cysts often form on joints in your hand or wrist. They can also form: On tendons, which are tissues that connect muscle to bone. On ligaments, which are tissues that connect bones to each other. On joints in your shoulder, elbow, hip, knee, ankle, or foot. The cysts are shaped like balls or eggs. They may be as small as a pea or bigger than a grape. They may get bigger as you move around. What are the causes? The exact cause of these cysts isn't known. But they may be caused by: Irritation or swelling near the joint. An injury or tear in the layers of tissue around the joint. These layers are called the joint capsule. Doing the same movements over and over. Using your joint too much. Having an injury in the past. What increases  the risk? You're more likely to get ganglion cysts if: You're female. You're 75-38 years old. What are the signs or symptoms? The main symptom is a lump. It's often on your hand or wrist. In most cases, there aren't other symptoms. In some cases, you may have: Tingling. Pain or tenderness. Numbness. Weakness or loss of strength in the joint. Less range of motion in the part of the body that has the cyst. How is this diagnosed? Ganglion cysts can be diagnosed with an exam. Your health care provider will feel the lump. They may also shine a light next to it. If it's a ganglion cyst, the light will shine through it.  You may also have tests. These are done to rule out other conditions. They may include: An X-ray. An ultrasound. An MRI. A CT scan. How is this treated? In many cases, you may not need treatment. Ganglion cysts often go away on their own. But you may need treatment if: You have pain or other symptoms. You can't move as well as you should. The cyst gets infected. To treat the cyst, you may need to: Wear a brace or splint. Take medicine. Have fluid drained from the cyst with a needle. Get a shot of medicine into the joint. Put a pad in your shoe, or wear shoes that don't rub against the cyst if it's on your foot. Have surgery to remove the cyst. Follow these instructions at home: If you have a brace or splint: Wear it as told. Take it off only if your provider says you can. Check the skin around it every day. Tell your provider if you see problems. Loosen the brace or splint if your fingers or toes tingle, are numb, or turn cold and blue. Keep the brace or splint clean and dry. General instructions Take your medicines only as told. If you have a brace or splint that isn't waterproof: Do not let it get wet. Remove it or cover it when you take a bath or shower. Use a cover that doesn't let any water  in. Do not press on the cyst, poke it with a needle, or hit it. Watch  the cyst for any changes. Where to find more information American Society for Surgery of the Hand (ASSH): assh.org [employer] ([employer]): foothealthfacts.org Contact a health care provider if: The cyst gets bigger or starts to hurt. There's pus coming from the cyst. You have weakness or numbness near the cyst. You have a fever or chills. Get help right away if: You have a fever and have any of these near the cyst: More redness. Red streaks. Swelling. This information is not intended to replace advice given to you by your health care provider. Make sure you discuss any questions you have with your health care provider. How to Use Chlorhexidine  at Home in the Shower Chlorhexidine  gluconate (CHG) is a germ-killing (antiseptic) wash that's used to clean the skin. It can get rid of the germs that normally live on the skin and can keep them away for about 24 hours. If you're having surgery, you may be told to shower with CHG at home the night before surgery. This can help lower your risk for infection. To use CHG wash in the shower, follow the steps below. Supplies needed: CHG body wash. Clean washcloth. Clean towel. How to use CHG in the shower Follow these steps unless you're told to use CHG in a different way: Start the shower. Use your normal soap and shampoo to wash your face and hair. Turn off the shower or move out of the shower stream. Pour CHG onto a clean washcloth. Do not use any type of brush or rough sponge. Start at your neck, washing your body down to your toes. Make sure you: Wash the part of your body where the surgery will be done for at least 1 minute. Do not scrub. Do not use CHG on your head or face unless your health care provider tells you to. If it gets into your ears or eyes, rinse them well with water . Do not wash your genitals with CHG. Wash your back and under your arms. Make sure  to wash skin folds. Let the CHG sit on your skin  for 1-2 minutes or as long as told. Rinse your entire body in the shower, including all body creases and folds. Turn off the shower. Dry off with a clean towel. Do not put anything on your skin afterward, such as powder, lotion, or perfume. Put on clean clothes or pajamas. If it's the night before surgery, sleep in clean sheets. General tips Use CHG only as told, and follow the instructions on the label. Use the full amount of CHG as told. This is often one bottle. Do not smoke and stay away from flames after using CHG. Your skin may feel sticky after using CHG. This is normal. The sticky feeling will go away as the CHG dries. Do not use CHG: If you have a chlorhexidine  allergy  or have reacted to chlorhexidine  in the past. On open wounds or areas of skin that have broken skin, cuts, or scrapes. On babies younger than 42 months of age. Contact a health care provider if: You have questions about using CHG. Your skin gets irritated or itchy. You have a rash after using CHG. You swallow any CHG. Call your local poison control center [phone] in the [place] Your eyes itch badly, or they become very red or swollen. Your hearing changes. You have trouble seeing. If you can't reach your provider, go to an urgent care or emergency room. Do not drive yourself. Get help right away if: You have swelling or tingling in your mouth or throat. You make high-pitched whistling sounds when you breathe, most often when you breathe out (wheeze). You have trouble breathing. These symptoms may be an emergency. Call 911 right away. Do not wait to see if the symptoms will go away. Do not drive yourself to the hospital. This information is not intended to replace advice given to you by your health care provider. Make sure you discuss any questions you have with your health care provider. Document Revised: 02/21/2023 Document Reviewed: 02/17/2022 Elsevier Patient Education  2024 Elsevier Inc. Document  Revised: 02/02/2023 Document Reviewed: 02/02/2023 Elsevier Patient Education  2024 [employer].

## 2023-12-22 ENCOUNTER — Encounter (HOSPITAL_COMMUNITY)
Admission: RE | Admit: 2023-12-22 | Discharge: 2023-12-22 | Disposition: A | Discharge status: Home / Self Care | Source: Ambulatory Visit | Attending: Orthopedic Surgery | Admitting: Orthopedic Surgery

## 2023-12-22 ENCOUNTER — Other Ambulatory Visit: Payer: Self-pay

## 2023-12-22 ENCOUNTER — Encounter (HOSPITAL_COMMUNITY): Payer: Self-pay

## 2023-12-22 DIAGNOSIS — Z01812 Encounter for preprocedural laboratory examination: Secondary | ICD-10-CM | POA: Diagnosis not present

## 2023-12-22 DIAGNOSIS — Z01818 Encounter for other preprocedural examination: Secondary | ICD-10-CM

## 2023-12-22 HISTORY — DX: Personal history of urinary calculi: Z87.442

## 2023-12-22 LAB — BASIC METABOLIC PANEL WITH GFR
Anion gap: 9 (ref 5–15)
BUN: 9 mg/dL (ref 6–20)
CO2: 20 mmol/L — ABNORMAL LOW (ref 22–32)
Calcium: 8.7 mg/dL — ABNORMAL LOW (ref 8.9–10.3)
Chloride: 108 mmol/L (ref 98–111)
Creatinine, Ser: 0.76 mg/dL (ref 0.44–1.00)
GFR, Estimated: >60 mL/min (ref >60)
Glucose, Bld: 110 mg/dL — ABNORMAL HIGH (ref 70–99)
Potassium: 3.8 mmol/L (ref 3.5–5.1)
Sodium: 137 mmol/L (ref 135–145)

## 2023-12-22 LAB — CBC
HCT: 38.3 % (ref 36.0–46.0)
Hemoglobin: 13 g/dL (ref 12.0–15.0)
MCH: 31 pg (ref 26.0–34.0)
MCHC: 33.9 g/dL (ref 30.0–36.0)
MCV: 91.4 fL (ref 80.0–100.0)
Platelets: 228 10*3/uL (ref 150–400)
RBC: 4.19 MIL/uL (ref 3.87–5.11)
RDW: 13.2 % (ref 11.5–15.5)
WBC: 6.2 10*3/uL (ref 4.0–10.5)
nRBC: 0 % (ref 0.0–0.2)

## 2023-12-22 LAB — PREGNANCY, URINE: Preg Test, Ur: NEGATIVE

## 2023-12-25 ENCOUNTER — Ambulatory Visit (HOSPITAL_COMMUNITY): Admitting: Anesthesiology

## 2023-12-25 ENCOUNTER — Ambulatory Visit (HOSPITAL_BASED_OUTPATIENT_CLINIC_OR_DEPARTMENT_OTHER): Admitting: Anesthesiology

## 2023-12-25 ENCOUNTER — Encounter (HOSPITAL_COMMUNITY)
Admission: RE | Disposition: A | Discharge status: Home / Self Care | Payer: Self-pay | Source: Home / Self Care | Attending: Orthopedic Surgery

## 2023-12-25 ENCOUNTER — Encounter (HOSPITAL_COMMUNITY): Payer: Self-pay | Admitting: Orthopedic Surgery

## 2023-12-25 ENCOUNTER — Ambulatory Visit (HOSPITAL_COMMUNITY)
Admission: RE | Admit: 2023-12-25 | Discharge: 2023-12-25 | Disposition: A | Discharge status: Home / Self Care | Attending: Orthopedic Surgery | Admitting: Orthopedic Surgery

## 2023-12-25 DIAGNOSIS — M67432 Ganglion, left wrist: Secondary | ICD-10-CM | POA: Insufficient documentation

## 2023-12-25 DIAGNOSIS — I10 Essential (primary) hypertension: Secondary | ICD-10-CM | POA: Diagnosis not present

## 2023-12-25 DIAGNOSIS — K219 Gastro-esophageal reflux disease without esophagitis: Secondary | ICD-10-CM | POA: Diagnosis not present

## 2023-12-25 HISTORY — PX: GANGLION CYST EXCISION: SHX1691

## 2023-12-25 SURGERY — EXCISION, GANGLION CYST, WRIST
Anesthesia: General | Site: Wrist | Laterality: Left

## 2023-12-25 MED ORDER — ONDANSETRON HCL 4 MG/2ML IJ SOLN
INTRAMUSCULAR | Status: DC | PRN
  Administered 2023-12-25: 4 mg via INTRAVENOUS

## 2023-12-25 MED ORDER — OXYCODONE HCL 5 MG PO TABS: 5.0000 mg | ORAL_TABLET | Freq: Once | ORAL | Status: DC | PRN

## 2023-12-25 MED ORDER — MIDAZOLAM HCL 2 MG/2ML IJ SOLN
INTRAMUSCULAR | Status: AC
  Filled 2023-12-25: qty 2

## 2023-12-25 MED ORDER — CEFAZOLIN SODIUM-DEXTROSE 2-4 GM/100ML-% IV SOLN
2.0000 g | INTRAVENOUS | Status: AC
Start: 2023-12-25 — End: 2023-12-25
  Administered 2023-12-25: 2 g via INTRAVENOUS

## 2023-12-25 MED ORDER — MIDAZOLAM HCL 2 MG/2ML IJ SOLN
INTRAMUSCULAR | Status: DC | PRN
  Administered 2023-12-25: 2 mg via INTRAVENOUS

## 2023-12-25 MED ORDER — FENTANYL CITRATE (PF) 100 MCG/2ML IJ SOLN
INTRAMUSCULAR | Status: AC
Start: 2023-12-25 — End: ?
  Filled 2023-12-25: qty 2

## 2023-12-25 MED ORDER — HYDROCODONE-ACETAMINOPHEN 5-325 MG PO TABS: 1.0000 | ORAL_TABLET | Freq: Four times a day (QID) | ORAL | 0 refills | Status: AC | PRN

## 2023-12-25 MED ORDER — FENTANYL CITRATE (PF) 100 MCG/2ML IJ SOLN
INTRAMUSCULAR | Status: DC | PRN
  Administered 2023-12-25: 25 ug via INTRAVENOUS
  Administered 2023-12-25: 50 ug via INTRAVENOUS
  Administered 2023-12-25: 25 ug via INTRAVENOUS

## 2023-12-25 MED ORDER — PROPOFOL 10 MG/ML IV BOLUS
INTRAVENOUS | Status: DC | PRN
  Administered 2023-12-25: 200 mg via INTRAVENOUS

## 2023-12-25 MED ORDER — BUPIVACAINE HCL (PF) 0.5 % IJ SOLN
INTRAMUSCULAR | Status: DC | PRN
  Administered 2023-12-25: 10 mL

## 2023-12-25 MED ORDER — FENTANYL CITRATE PF 50 MCG/ML IJ SOSY: 25.0000 ug | PREFILLED_SYRINGE | INTRAMUSCULAR | Status: DC | PRN

## 2023-12-25 MED ORDER — DEXAMETHASONE SODIUM PHOSPHATE 10 MG/ML IJ SOLN
INTRAMUSCULAR | Status: DC | PRN
  Administered 2023-12-25: 10 mg via INTRAVENOUS

## 2023-12-25 MED ORDER — ORAL CARE MOUTH RINSE: 15.0000 mL | Freq: Once | OROMUCOSAL | Status: DC

## 2023-12-25 MED ORDER — SODIUM CHLORIDE 0.9 % IR SOLN
Status: DC | PRN
  Administered 2023-12-25: 1000 mL

## 2023-12-25 MED ORDER — BUPIVACAINE HCL (PF) 0.5 % IJ SOLN
INTRAMUSCULAR | Status: AC
  Filled 2023-12-25: qty 30

## 2023-12-25 MED ORDER — ONDANSETRON HCL 4 MG PO TABS: 4.0000 mg | ORAL_TABLET | Freq: Three times a day (TID) | ORAL | 0 refills | Status: AC | PRN

## 2023-12-25 MED ORDER — OXYCODONE HCL 5 MG/5ML PO SOLN: 5.0000 mg | Freq: Once | ORAL | Status: DC | PRN

## 2023-12-25 MED ORDER — CHLORHEXIDINE GLUCONATE 0.12 % MT SOLN
15.0000 mL | OROMUCOSAL | Status: AC
  Administered 2023-12-25: 15 mL via OROMUCOSAL

## 2023-12-25 MED ORDER — LIDOCAINE 2% (20 MG/ML) 5 ML SYRINGE
INTRAMUSCULAR | Status: DC | PRN
  Administered 2023-12-25: 80 mg via INTRAVENOUS

## 2023-12-25 MED ORDER — PROPOFOL 500 MG/50ML IV EMUL
INTRAVENOUS | Status: AC
  Filled 2023-12-25: qty 50

## 2023-12-25 MED ORDER — LIDOCAINE 2% (20 MG/ML) 5 ML SYRINGE
INTRAMUSCULAR | Status: AC
  Filled 2023-12-25: qty 5

## 2023-12-25 MED ORDER — CEFAZOLIN SODIUM-DEXTROSE 2-4 GM/100ML-% IV SOLN
INTRAVENOUS | Status: AC
  Filled 2023-12-25: qty 100

## 2023-12-25 MED ORDER — LACTATED RINGERS IV SOLN
INTRAVENOUS | Status: DC | PRN
Start: 2023-12-25 — End: 2023-12-25

## 2023-12-25 MED ORDER — PHENYLEPHRINE 80 MCG/ML (10ML) SYRINGE FOR IV PUSH (FOR BLOOD PRESSURE SUPPORT)
PREFILLED_SYRINGE | INTRAVENOUS | Status: DC | PRN
  Administered 2023-12-25: 160 ug via INTRAVENOUS

## 2023-12-25 MED ORDER — CHLORHEXIDINE GLUCONATE 0.12 % MT SOLN: 15.0000 mL | Freq: Once | OROMUCOSAL | Status: DC

## 2023-12-25 MED ORDER — KETOROLAC TROMETHAMINE 30 MG/ML IJ SOLN
INTRAMUSCULAR | Status: AC
  Filled 2023-12-25: qty 1

## 2023-12-25 MED ORDER — ONDANSETRON HCL 4 MG/2ML IJ SOLN: 4.0000 mg | Freq: Once | INTRAMUSCULAR | Status: DC | PRN

## 2023-12-25 MED ORDER — LACTATED RINGERS IV SOLN: INTRAVENOUS | Status: DC

## 2023-12-25 SURGICAL SUPPLY — 30 items
BANDAGE ESMARK 4X12 BL STRL LF (DISPOSABLE) ×1 IMPLANT
BLADE SURG 15 STRL LF DISP TIS (BLADE) ×1 IMPLANT
BNDG ELASTIC 3X5.8 VLCR NS LF (GAUZE/BANDAGES/DRESSINGS) ×1 IMPLANT
CHLORAPREP W/TINT 26 (MISCELLANEOUS) ×1 IMPLANT
CLOTH BEACON ORANGE TIMEOUT ST (SAFETY) ×1 IMPLANT
COVER LIGHT HANDLE STERIS (MISCELLANEOUS) ×2 IMPLANT
CUFF TOURN SGL QUICK 18X4 (TOURNIQUET CUFF) ×1 IMPLANT
DRAPE HALF SHEET 40X57 (DRAPES) IMPLANT
GAUZE SPONGE 4X4 12PLY STRL (GAUZE/BANDAGES/DRESSINGS) ×1 IMPLANT
GLOVE BIO SURGEON STRL SZ8 (GLOVE) ×1 IMPLANT
GLOVE BIOGEL PI IND STRL 7.0 (GLOVE) ×2 IMPLANT
GLOVE BIOGEL PI IND STRL 8 (GLOVE) ×1 IMPLANT
GOWN STRL REUS W/TWL LRG LVL3 (GOWN DISPOSABLE) ×1 IMPLANT
GOWN STRL REUS W/TWL XL LVL3 (GOWN DISPOSABLE) ×2 IMPLANT
KIT TURNOVER KIT A (KITS) ×1 IMPLANT
MANIFOLD NEPTUNE II (INSTRUMENTS) ×1 IMPLANT
NDL HYPO 21X1.5 SAFETY (NEEDLE) ×1 IMPLANT
NEEDLE HYPO 21X1.5 SAFETY (NEEDLE) ×1 IMPLANT
NS IRRIG 1000ML POUR BTL (IV SOLUTION) ×1 IMPLANT
PACK BASIC LIMB (CUSTOM PROCEDURE TRAY) ×1 IMPLANT
PAD ARMBOARD POSITIONER FOAM (MISCELLANEOUS) ×1 IMPLANT
PAD CAST 4YDX4 CTTN HI CHSV (CAST SUPPLIES) IMPLANT
POSITIONER HEAD 8X9X4 ADT (SOFTGOODS) ×1 IMPLANT
SET BASIN LINEN APH (SET/KITS/TRAYS/PACK) ×1 IMPLANT
SPLINT PLASTER CAST XFAST 5X30 (CAST SUPPLIES) IMPLANT
STRIP CLOSURE SKIN 1/2X4 (GAUZE/BANDAGES/DRESSINGS) IMPLANT
SUT MNCRL AB 4-0 PS2 18 (SUTURE) IMPLANT
SUT MON AB 2-0 SH27 (SUTURE) IMPLANT
SUT PROLENE 3 0 PS 2 (SUTURE) ×1 IMPLANT
SYR CONTROL 10ML LL (SYRINGE) ×1 IMPLANT

## 2023-12-25 NOTE — Anesthesia Preprocedure Evaluation (Signed)
 Anesthesia Evaluation  Patient identified by MRN, date of birth, ID band Patient awake    Reviewed: Allergy  & Precautions, H&P , NPO status , Patient's Chart, lab work & pertinent test results, reviewed documented beta blocker date and time   Airway Mallampati: II  TM Distance: >3 FB Neck ROM: full    Dental no notable dental hx.    Pulmonary neg pulmonary ROS, Patient abstained from smoking.   Pulmonary exam normal breath sounds clear to auscultation       Cardiovascular Exercise Tolerance: Good hypertension, negative cardio ROS  Rhythm:regular Rate:Normal     Neuro/Psych negative neurological ROS  negative psych ROS   GI/Hepatic Neg liver ROS,GERD  ,,  Endo/Other  negative endocrine ROS    Renal/GU negative Renal ROS  negative genitourinary   Musculoskeletal   Abdominal   Peds  Hematology negative hematology ROS (+)   Anesthesia Other Findings   Reproductive/Obstetrics negative OB ROS                             Anesthesia Physical Anesthesia Plan  ASA: 2  Anesthesia Plan: General and General LMA   Post-op Pain Management:    Induction:   PONV Risk Score and Plan: Ondansetron  Airway Management Planned:   Additional Equipment:   Intra-op Plan:   Post-operative Plan:   Informed Consent: I have reviewed the patients History and Physical, chart, labs and discussed the procedure including the risks, benefits and alternatives for the proposed anesthesia with the patient or authorized representative who has indicated his/her understanding and acceptance.     Dental Advisory Given  Plan Discussed with: CRNA  Anesthesia Plan Comments:        Anesthesia Quick Evaluation

## 2023-12-25 NOTE — Anesthesia Postprocedure Evaluation (Signed)
 Anesthesia Post Note  Patient: Katie Blankenship  Procedure(s) Performed: EXCISION, GANGLION CYST, WRIST (Left: Wrist)  Patient location during evaluation: Phase II Anesthesia Type: General Level of consciousness: awake Pain management: pain level controlled Vital Signs Assessment: post-procedure vital signs reviewed and stable Respiratory status: spontaneous breathing and respiratory function stable Cardiovascular status: blood pressure returned to baseline and stable Postop Assessment: no headache and no apparent nausea or vomiting Anesthetic complications: no Comments: Late entry   No notable events documented.   Last Vitals:  Vitals:   12/25/23 0942 12/25/23 0950  BP: 105/77 113/80  Pulse:  88  Resp:  16  Temp: 37 C 36.8 C  SpO2:  98%    Last Pain:  Vitals:   12/25/23 0953  TempSrc:   PainSc: 0-No pain                 Coretha Dew

## 2023-12-25 NOTE — Transfer of Care (Signed)
 Immediate Anesthesia Transfer of Care Note  Patient: Katie Blankenship  Procedure(s) Performed: EXCISION, GANGLION CYST, WRIST (Left: Wrist)  Patient Location: PACU  Anesthesia Type:General  Level of Consciousness: awake  Airway & Oxygen Therapy: Patient Spontanous Breathing  Post-op Assessment: Report given to RN and Post -op Vital signs reviewed and stable  Post vital signs: Reviewed and stable  Last Vitals:  Vitals Value Taken Time  BP 107/68   Temp 98.8   Pulse 105   Resp 22   SpO2 97%     Last Pain:  Vitals:   12/25/23 0641  PainSc: 3       Patients Stated Pain Goal: 2 (12/25/23 0641)  Complications: No notable events documented.

## 2023-12-25 NOTE — Anesthesia Procedure Notes (Signed)
 Procedure Name: LMA Insertion Date/Time: 12/25/2023 7:37 AM  Performed by: Leeanne Puffer, CRNAPre-anesthesia Checklist: Patient identified, Patient being monitored, Emergency Drugs available, Timeout performed and Suction available Patient Re-evaluated:Patient Re-evaluated prior to induction Oxygen Delivery Method: Circle System Utilized Preoxygenation: Pre-oxygenation with 100% oxygen Induction Type: IV induction Ventilation: Mask ventilation without difficulty LMA: LMA inserted LMA Size: 3.0 Number of attempts: 1 Placement Confirmation: positive ETCO2 and breath sounds checked- equal and bilateral

## 2023-12-25 NOTE — Interval H&P Note (Signed)
 History and Physical Interval Note:  12/25/2023 7:20 AM  Katie Blankenship  has presented today for surgery, with the diagnosis of Left wrist dorsal ganglion cyst.  The various methods of treatment have been discussed with the patient and family. After consideration of risks, benefits and other options for treatment, the patient has consented to  Procedure(s): EXCISION, GANGLION CYST, WRIST (Left) as a surgical intervention.  The patient's history has been reviewed, patient examined, no change in status, stable for surgery.  I have reviewed the patient's chart and labs.  Questions were answered to the patient's satisfaction.     Tonita Frater

## 2023-12-25 NOTE — Discharge Instructions (Signed)
 Heidy Mccubbin A. Ernesta Heading, MD MS [facility] [address] [place],  [place]  [address] Phone: [phone] Fax: [phone]   POST-OPERATIVE INSTRUCTIONS - UPPER EXTREMITY   WOUND CARE Please keep splint clean dry and intact until followup.  You may shower on Post-Op Day #2.  You must keep splint dry during this process and may find that a plastic bag taped around the arm or alternatively a towel based bath may be a better option.   If you get your splint wet or if it is damaged please contact our clinic.  EXERCISES Due to your splint being in place you will not be able to bear weight through your extremity.   DO NOT PUT ANY WEIGHT ON YOUR OPERATIVE ARM   REGIONAL ANESTHESIA (NERVE BLOCKS) The anesthesia team may have performed a nerve block for you if safe in the setting of your care.  This is a great tool used to minimize pain.  Typically the block may start wearing off overnight but the long acting medicine may last for 3-4 days.  The nerve block wearing off can be a challenging period but please utilize your as needed pain medications to try and manage this period.    POST-OP MEDICATIONS- Multimodal approach to pain control  In general your pain will be controlled with a combination of substances.  Prescriptions unless otherwise discussed are electronically sent to your pharmacy.  This is a carefully made plan we use to minimize narcotic use.     - Ibuprofen - Anti-inflammatory medication that can be taken as needed  - Acetaminophen  - Non-narcotic pain medicine that can be taken as needed   - Hydrocodone  - This is a strong narcotic, to be used only on an "as needed" basis for pain.             -          Zofran - take as needed for nausea   FOLLOW-UP If you develop a Fever (>101.5), Redness or Drainage from the surgical incision site, please call our office to arrange for an evaluation. Please call the office to schedule a follow-up appointment for your  incision check if you do not already have one, 10-14 days post-operatively.  IF YOU HAVE ANY QUESTIONS, PLEASE FEEL FREE TO CALL OUR OFFICE.  HELPFUL INFORMATION  If you had a block, it will wear off between 8-24 hrs postop typically.  This is period when your pain may go from nearly zero to the pain you would have had postop without the block.  This is an abrupt transition but nothing dangerous is happening.  You may take an extra dose of narcotic when this happens.  You should wean off your narcotic medicines as soon as you are able.  Most patients will be off or using minimal narcotics before their first postop appointment.   Elevating your leg will help with swelling and pain control.  You are encouraged to elevate your leg as much as possible in the first couple of weeks following surgery.  Imagine a drop of water  on your toe, and your goal is to get that water  back to your heart.  We suggest you use the pain medication the first night prior to going to bed, in order to ease any pain when the anesthesia wears off. You should avoid taking pain medications on an empty stomach as it will make you nauseous.  Do not drink alcoholic beverages or take illicit drugs when taking pain  medications.  In most states it is against the law to drive while you are in a splint or sling.  And certainly against the law to drive while taking narcotics.  You may return to work/school in the next couple of days when you feel up to it.   Pain medication may make you constipated.  Below are a few solutions to try in this order: Decrease the amount of pain medication if you aren't having pain. Drink lots of decaffeinated fluids. Drink prune juice and/or each dried prunes  If the first 3 don't work start with additional solutions Take Colace - an over-the-counter stool softener Take Senokot - an over-the-counter laxative Take Miralax - a stronger over-the-counter laxative

## 2023-12-25 NOTE — Op Note (Signed)
 Orthopaedic Surgery Operative Note (CSN: [id number])  Katie Blankenship  Aug 10, 1981 Date of Surgery: 12/25/2023   Diagnoses:  Left wrist dorsal ganglion cyst  Procedure: Excision of the left wrist dorsal ganglion cyst   Operative Finding Successful completion of the planned procedure.  Large, multiloculated cyst, originating from the SL interval.   Post-Op Diagnosis: Same Surgeons:Primary: Tonita Frater, MD Assistants: None Location: [facility] OR ROOM 4 Anesthesia: General with local anesthesia Antibiotics: Ancef 2 g Tourniquet time:  Total Tourniquet Time Documented: Upper Arm (Left) - 59 minutes Total: Upper Arm (Left) - 59 minutes  Estimated Blood Loss: 10 cc Complications: None Specimens: None  Implants: None  Indications for Surgery:   Katie Blankenship is a 43 y.o. female who has a large soft tissue mass on the dorsal aspect of the left wrist.  This has been in place for over a year.  It is causing her some discomfort, and she does not like the way it appears.  Benefits and risks of operative and nonoperative management were discussed prior to surgery with the patient and informed consent form was completed.  Specific risks including infection, need for additional surgery, damage to surrounding structures, recurrence of cyst, persistent pain, stiffness, bleeding, blood clots and more severe complications associated with anesthesia.  She elected proceed.  Surgical consent was finalized.   Procedure:   The patient was identified properly. Informed consent was obtained and the surgical site was marked. The patient was taken to the OR where general anesthesia was induced.  The patient was positioned supine, on a hand table.  The left arm was prepped and draped in the usual sterile fashion.  Timeout was performed before the beginning of the case.  Tourniquet was used for the above duration.  We made a transverse incision, directly over the growth, over the dorsal aspect of the wrist.   Swelling was more prominent on the radial aspect of the wrist.  We incised carefully through skin.  We then bluntly dissected to expose the capsule of the cyst.  Carefully, we entered the cyst capsule, and then continued to expose the cyst in all directions.  There were multiple loculations of the cyst.  The stalk projected deep towards the middle aspect of the wrist.  We followed this carefully.  We identified the extensor tendons, and these were carefully retracted.  Soft tissue was protected in order to expose the cyst to its origin.  The stalk was identified, and carefully cleared.  This was clearly originating from the SL interval.  As close to the joint as possible, the cyst was truncated using combination of scissor, as well as bipolar cautery.  The cyst was decompressed, and contain clear gelatinous fluid.  This was consistent with a ganglion cyst.  The dorsal aspect of the wrist was then carefully evaluated.  Nothing was damaged, or needed to be repaired.  We irrigated the wound copiously.  We closed the incision in a multilayer fashion with absorbable suture.  Sterile dressing was placed, followed by a volar-based splint.  Patient was awoken taken to PACU in stable condition.   Post-operative plan:  The patient will be NWB on the operative extremity Discharge home from the PACU once they have recovered DVT prophylaxis not indicated in this ambulatory upper extremity patient without significant risk factors.    Pain control with PRN pain medication preferring oral medicines.   Follow up plan will be scheduled in approximately 7-10 days for incision check.

## 2023-12-26 ENCOUNTER — Encounter (HOSPITAL_COMMUNITY): Payer: Self-pay | Admitting: Orthopedic Surgery

## 2024-01-03 ENCOUNTER — Ambulatory Visit (INDEPENDENT_AMBULATORY_CARE_PROVIDER_SITE_OTHER): Admitting: Orthopedic Surgery

## 2024-01-03 ENCOUNTER — Encounter: Payer: Self-pay | Admitting: Orthopedic Surgery

## 2024-01-03 DIAGNOSIS — M67432 Ganglion, left wrist: Secondary | ICD-10-CM

## 2024-01-03 NOTE — Progress Notes (Unsigned)
 Orthopaedic Postop Note  Assessment: Katie Blankenship is a 43 y.o. female s/p left dorsal ganglion cyst excision  DOS: 12/24/2023  Plan: Dressing removed Sutures trimmed Steri-Strips placed Cover incision as needed. Limit intense activities with the left hand for the next 2-4 weeks Medicines as needed Repeat evaluation in 3 weeks.  Follow-up: Return in about 3 weeks (around 01/24/2024). XR at next visit: None  Subjective:  Chief Complaint  Patient presents with   Post-op Follow-up    Left ganglion excision     History of Present Illness: Katie Blankenship is a 43 y.o. female who presents following the above stated procedure.  Surgery was approximately 10 days ago.  She is doing well.  She has not needed any pain medicine.  She has tolerated the brace and dressing well.  No numbness or tingling.  Review of Systems: No fevers or chills No numbness or tingling No Chest Pain No shortness of breath   Objective: LMP 11/29/2023 Comment: 12/22/23 negative urine pregnancy  Physical Exam:  Dorsal based incision on the left wrist is healing appropriately.  No surrounding erythema or drainage.  Mild swelling in the area.  No bruising.  No obvious cyst formation.  Fingers are warm and well-perfused.  Sensation intact throughout the left hand.  IMAGING: I personally ordered and reviewed the following images:  No new imaging obtained today.  Tonita Frater, MD 01/03/2024 4:45 PM

## 2024-01-23 ENCOUNTER — Encounter: Admitting: Orthopedic Surgery

## 2024-01-24 ENCOUNTER — Encounter: Admitting: Orthopedic Surgery

## 2024-01-30 ENCOUNTER — Encounter: Payer: Self-pay | Admitting: Orthopedic Surgery

## 2024-01-30 ENCOUNTER — Ambulatory Visit (INDEPENDENT_AMBULATORY_CARE_PROVIDER_SITE_OTHER): Admitting: Orthopedic Surgery

## 2024-01-30 DIAGNOSIS — M67432 Ganglion, left wrist: Secondary | ICD-10-CM

## 2024-01-30 NOTE — Progress Notes (Unsigned)
 Orthopaedic Postop Note  Assessment: Katie Blankenship is a 43 y.o. female s/p left dorsal ganglion cyst excision  DOS: 12/24/2023  Plan: Incision is healing well.  Some residual swelling and tenderness.  No recurrence of the cyst at this time.  Provided reassurance.  Recommend she continue to work on range of motion.  As the swelling improves, I believe her range of motion will continue to improve.  She is pleased with the results so far.  Exercises have been provided.  She will follow-up as needed.  Follow-up: Return if symptoms worsen or fail to improve. XR at next visit: None  Subjective:  Chief Complaint  Patient presents with   Routine Post Op    L cyst removal DOS 12/25/23    History of Present Illness: Katie Blankenship is a 43 y.o. female who presents following the above stated procedure.  Surgery was approximately 1 month ago.  She reports that she continues to improve.  She has some residual swelling and tenderness over the dorsum of the wrist.  She is lacking some extension.  Otherwise, she is working on range of motion.  No issues with her incisions.  She is not taking any pain medicine   Review of Systems: No fevers or chills No numbness or tingling No Chest Pain No shortness of breath   Objective: There were no vitals taken for this visit.  Physical Exam:  Dorsal wrist incision is healing.  No surrounding erythema or drainage.  No tenderness.  No bruising is appreciated.  Mild swelling of the dorsum of the wrist.  She is lacking approximately 30 degrees of extension, due to some tenderness.  She is able to make a fist.  Sensation is intact throughout the left hand.  Fingers are warm and well-perfused.  IMAGING: I personally ordered and reviewed the following images:  No new imaging obtained today.  Tonita Frater, MD 01/31/2024 8:33 AM

## 2024-01-30 NOTE — Patient Instructions (Signed)
Wrist Fracture Rehab Ask your health care provider which exercises are safe for you. Do exercises exactly as told by your health care provider and adjust them as directed. It is normal to feel mild stretching, pulling, tightness, or discomfort as you do these exercises. Stop right away if you feel sudden pain or your pain gets worse. Do not begin these exercises until told by your health care provider. Stretching and range-of-motion exercises These exercises warm up your muscles and joints and improve the movement and flexibility of your wrist and hand. These exercises also help to relieve pain,numbness, and tingling. Finger flexion and extension Sit or stand with your elbow at your side. Open and stretch your left / right fingers as wide as you can (extension). Hold this position for 10 seconds. Close your left / right fingers into a gentle fist (flexion). Hold this position for 10 seconds. Slowly return to the starting position. Repeat 10 times. Complete this exercise 1-2 times a day. Wrist flexion Bend your left / right elbow to a 90-degree angle (right angle) with your palm facing the floor. Bend your wrist forward so your fingers point toward the floor (flexion). Hold this position for 10 seconds. Slowly return to the starting position. Repeat 10 times. Complete this exercise 1-2 times a day. Wrist extension Bend your left / right elbow to a 90-degree angle (right angle) with your palm facing the floor. Bend your wrist backward so your fingers point toward the ceiling (extension). Hold this position for 10 seconds. Slowly return to the starting position. Repeat 10 times. Complete this exercise 1-2 times a day. Ulnar deviation Bend your left / right elbow to a 90-degree angle (right angle), and rest your forearm on a table with your palm facing down. Keeping your hand flat on the table, bend your left / right wrist toward your small finger (pinkie). This is ulnar deviation. Hold this  position for 10 seconds. Slowly return to the starting position. Repeat 10 times. Complete this exercise 1-2 times a day. Radial deviation Bend your left / right elbow to a 90-degree angle (right angle), and rest your forearm on a table with your palm facing down. Keeping your hand flat on the table, bend your left / right wrist toward your thumb. This is radial deviation. Hold this position for 10 seconds. Slowly return to the starting position. Repeat 10 times. Complete this exercise 1-2 times a day. Forearm rotation, supination Stand or sit with your left / right elbow bent to a 90-degree angle (right angle) at your side. Position your forearm so that the thumb is facing the ceiling (neutral position). Turn (rotate) your palm up toward the ceiling (supination), stopping when you feel a gentle stretch. Hold this position for 10 seconds. Slowly return to the starting position. Repeat 10 times. Complete this exercise 1-2 times a day. Forearm rotation, pronation Stand or sit with your left / right elbow bent to a 90-degree angle (right angle) at your side. Position your forearm so that the thumb is facing the ceiling (neutral position). Turn (rotate) your palm down toward the floor (pronation), stopping when you feel a gentle stretch. Hold this position for 10 seconds. Slowly return to the starting position. Repeat 10 times. Complete this exercise 1-2 times a day. Wrist flexion stretch  Extend your left / right arm in front of you and turn your palm down toward the floor. If told by your health care provider, bend your left / right arm to a 90-degree angle (  right angle) at your side. Using your uninjured hand, gently press over the back of your left / right hand to bend your wrist and fingers toward the floor (flexion). Go as far as you can to feel a stretch without causing pain. Hold this position for 10 seconds. Slowly return to the starting position. Repeat 10 times. Complete this  exercise 1-2 times a day. Wrist extension stretch  Extend your left / right arm in front of you and turn your palm up toward the ceiling. If told by your health care provider, bend your left / right arm to a 90-degree angle (right angle) at your side. Using your uninjured hand, gently press over the palm of your left / right hand to bend your wrist and fingers toward the floor (extension). Go as far as you can to feel a stretch without causing pain. Hold this position for 10 seconds. Slowly return to the starting position. Repeat 10 times. Complete this exercise 1-2 times a day. Forearm rotation stretch, supination Stand or sit with your arms at your sides. Bend your left / right elbow to a 90-degree angle (right angle). Using your uninjured hand, turn your left / right palm up toward the ceiling (assisted supination) until you feel a gentle stretch in the inside of your forearm. Hold this position for 10 seconds. Slowly return to the starting position. Repeat 10 times. Complete this exercise 1-2 times a day. Forearm rotation stretch, pronation Stand or sit with your arms at your sides. Bend your left / right elbow to a 90-degree angle (right angle). Using your uninjured hand, turn your left / right palm down toward the floor (assisted pronation) until you feel a gentle stretch in the top of your forearm. Hold this position for 10 seconds. Slowly return to the starting position. Repeat 10 times. Complete this exercise 1-2 times a day. Strengthening exercises These exercises build strength and endurance in your wrist and hand. Enduranceis the ability to use your muscles for a long time, even after they get tired. Wrist flexion Sit with your left / right forearm supported on a table. Your elbow should be at waist height. Rest your hand over the edge of the table, palm up. Gently grasp a 5 lb / kg weight (can of soup). Or, hold an exercise band or tube in both hands, keeping your hands at  the same level and hip distance apart. There should be slight tension in the exercise band or tube. Without moving your forearm or elbow, slowly bend your wrist up toward the ceiling (wrist flexion). Hold this position for 10 seconds. Slowly return to the starting position. Repeat 10 times. Complete this exercise 1-2 times a day. Wrist extension Sit with your left / right forearm supported on a table. Your elbow should be at waist height. Rest your hand over the edge of the table, palm down. Gently grasp a 5 lb / kg weight. Or, hold an exercise band or tube in both hands, keeping your hands at the same level and hip distance apart. There should be slight tension in the exercise band or tube. Without moving your forearm or elbow, slowly curl your hand up toward the ceiling (extension). Hold this position for 10 seconds. Slowly return to the starting position. Repeat 10 times. Complete this exercise 1-2 times a day. Forearm rotation, supination  Sit with your left / right forearm supported on a table. Your elbow should be at waist height. Rest your hand over the edge of the  table, palm down. Gently grasp a lightweight hammer near the head. As this exercise gets easier for you, try holding the hammer farther down the handle. Without moving your elbow, slowly turn (rotate) your palm up toward the ceiling (supination). Hold this position for 10 seconds. Slowly return to the starting position. Repeat 10 times. Complete this exercise 1-2 times a day. Forearm rotation, pronation  Sit with your left / right forearm supported on a table. Your elbow should be at waist height. Rest your hand over the edge of the table, palm up. Gently grasp a lightweight hammer near the head. As this exercise gets easier for you, try holding the hammer farther down the handle. Without moving your elbow, slowly turn (rotate) your palm down toward the floor (pronation). Hold this position for 10 seconds. Slowly return  to the starting position. Repeat 10 times. Complete this exercise 1-2 times a day. Grip strengthening  Hold one of these items in your left / right hand: a dense sponge, a stress ball, or a large, rolled sock. Slowly squeeze the object as hard as you can without increasing any pain. Hold your squeeze for 10 seconds. Slowly release your grip. Repeat 10 times. Complete this exercise 1-2 times a day. This information is not intended to replace advice given to you by your health care provider. Make sure you discuss any questions you have with your healthcare provider. Document Revised: 12/19/2019 Document Reviewed: 12/19/2019 Elsevier Patient Education  [employer].

## 2024-03-21 ENCOUNTER — Other Ambulatory Visit: Payer: Self-pay | Admitting: Medical Genetics

## 2024-06-11 ENCOUNTER — Other Ambulatory Visit: Payer: Self-pay | Admitting: Medical Genetics

## 2024-06-11 DIAGNOSIS — Z006 Encounter for examination for normal comparison and control in clinical research program: Secondary | ICD-10-CM
